# Patient Record
Sex: Male | Born: 1940 | Race: White | Hispanic: No | Marital: Married | Smoking: Former smoker
Health system: Southern US, Community
[De-identification: ages and names within clinical notes are randomized; demographics above are authoritative.]

## PROBLEM LIST (undated history)

## (undated) DIAGNOSIS — G2581 Restless legs syndrome: Secondary | ICD-10-CM

## (undated) DIAGNOSIS — N529 Male erectile dysfunction, unspecified: Secondary | ICD-10-CM

## (undated) DIAGNOSIS — E7800 Pure hypercholesterolemia, unspecified: Secondary | ICD-10-CM

## (undated) DIAGNOSIS — E785 Hyperlipidemia, unspecified: Secondary | ICD-10-CM

## (undated) DIAGNOSIS — I2510 Atherosclerotic heart disease of native coronary artery without angina pectoris: Secondary | ICD-10-CM

## (undated) DIAGNOSIS — Z872 Personal history of diseases of the skin and subcutaneous tissue: Secondary | ICD-10-CM

## (undated) DIAGNOSIS — F32A Depression, unspecified: Secondary | ICD-10-CM

## (undated) DIAGNOSIS — N183 Chronic kidney disease, stage 3 unspecified: Secondary | ICD-10-CM

## (undated) DIAGNOSIS — F329 Major depressive disorder, single episode, unspecified: Secondary | ICD-10-CM

## (undated) DIAGNOSIS — N419 Inflammatory disease of prostate, unspecified: Secondary | ICD-10-CM

## (undated) DIAGNOSIS — M1990 Unspecified osteoarthritis, unspecified site: Secondary | ICD-10-CM

## (undated) DIAGNOSIS — M545 Low back pain, unspecified: Secondary | ICD-10-CM

## (undated) DIAGNOSIS — I10 Essential (primary) hypertension: Secondary | ICD-10-CM

## (undated) DIAGNOSIS — K5790 Diverticulosis of intestine, part unspecified, without perforation or abscess without bleeding: Secondary | ICD-10-CM

## (undated) DIAGNOSIS — N189 Chronic kidney disease, unspecified: Secondary | ICD-10-CM

## (undated) DIAGNOSIS — I219 Acute myocardial infarction, unspecified: Secondary | ICD-10-CM

## (undated) HISTORY — DX: Major depressive disorder, single episode, unspecified: F32.9

## (undated) HISTORY — DX: Unspecified osteoarthritis, unspecified site: M19.90

## (undated) HISTORY — DX: Diverticulosis of intestine, part unspecified, without perforation or abscess without bleeding: K57.90

## (undated) HISTORY — PX: COLONOSCOPY: SHX174

## (undated) HISTORY — DX: Low back pain: M54.5

## (undated) HISTORY — DX: Depression, unspecified: F32.A

## (undated) HISTORY — DX: Chronic kidney disease, unspecified: N18.9

## (undated) HISTORY — PX: KNEE ARTHROSCOPY W/ ACL RECONSTRUCTION: SHX1858

## (undated) HISTORY — DX: Personal history of diseases of the skin and subcutaneous tissue: Z87.2

## (undated) HISTORY — DX: Acute myocardial infarction, unspecified: I21.9

## (undated) HISTORY — DX: Low back pain, unspecified: M54.50

## (undated) HISTORY — DX: Inflammatory disease of prostate, unspecified: N41.9

## (undated) HISTORY — DX: Restless legs syndrome: G25.81

## (undated) HISTORY — DX: Male erectile dysfunction, unspecified: N52.9

---

## 2001-06-13 ENCOUNTER — Ambulatory Visit (HOSPITAL_COMMUNITY): Admission: RE | Admit: 2001-06-13 | Discharge: 2001-06-13 | Payer: Self-pay | Admitting: Gastroenterology

## 2005-04-15 ENCOUNTER — Encounter: Admission: RE | Admit: 2005-04-15 | Discharge: 2005-07-14 | Payer: Self-pay | Admitting: Internal Medicine

## 2006-07-20 ENCOUNTER — Encounter: Admission: RE | Admit: 2006-07-20 | Discharge: 2006-07-20 | Payer: Self-pay | Admitting: Internal Medicine

## 2007-04-29 ENCOUNTER — Emergency Department (HOSPITAL_COMMUNITY): Admission: EM | Admit: 2007-04-29 | Discharge: 2007-04-29 | Payer: Self-pay | Admitting: Emergency Medicine

## 2011-07-12 LAB — I-STAT 8, (EC8 V) (CONVERTED LAB)
BUN: 19
Bicarbonate: 24
HCT: 50
Hemoglobin: 17
Operator id: 294521
Sodium: 139

## 2011-07-12 LAB — CBC
HCT: 47.7
Hemoglobin: 16.6
RBC: 5.23
WBC: 6.3

## 2011-07-12 LAB — URINALYSIS, ROUTINE W REFLEX MICROSCOPIC
Bilirubin Urine: NEGATIVE
Glucose, UA: NEGATIVE
Hgb urine dipstick: NEGATIVE
Ketones, ur: NEGATIVE
pH: 5.5

## 2011-07-12 LAB — DIFFERENTIAL
Eosinophils Relative: 2
Lymphocytes Relative: 24
Lymphs Abs: 1.5
Monocytes Absolute: 0.6
Monocytes Relative: 10

## 2012-02-02 DIAGNOSIS — E119 Type 2 diabetes mellitus without complications: Secondary | ICD-10-CM | POA: Insufficient documentation

## 2012-02-02 DIAGNOSIS — R197 Diarrhea, unspecified: Secondary | ICD-10-CM | POA: Insufficient documentation

## 2012-02-02 DIAGNOSIS — E7800 Pure hypercholesterolemia, unspecified: Secondary | ICD-10-CM | POA: Insufficient documentation

## 2012-02-02 DIAGNOSIS — I2510 Atherosclerotic heart disease of native coronary artery without angina pectoris: Secondary | ICD-10-CM | POA: Insufficient documentation

## 2012-02-03 ENCOUNTER — Encounter (HOSPITAL_COMMUNITY): Payer: Self-pay

## 2012-02-03 ENCOUNTER — Emergency Department (HOSPITAL_COMMUNITY)
Admission: EM | Admit: 2012-02-03 | Discharge: 2012-02-03 | Disposition: A | Discharge status: Home / Self Care | Payer: Medicare Other | Attending: Emergency Medicine | Admitting: Emergency Medicine

## 2012-02-03 DIAGNOSIS — R197 Diarrhea, unspecified: Secondary | ICD-10-CM

## 2012-02-03 HISTORY — DX: Atherosclerotic heart disease of native coronary artery without angina pectoris: I25.10

## 2012-02-03 HISTORY — DX: Pure hypercholesterolemia, unspecified: E78.00

## 2012-02-03 LAB — DIFFERENTIAL
Basophils Absolute: 0 10*3/uL (ref 0.0–0.1)
Basophils Relative: 0 % (ref 0–1)
Eosinophils Absolute: 0 10*3/uL (ref 0.0–0.7)
Neutrophils Relative %: 78 % — ABNORMAL HIGH (ref 43–77)

## 2012-02-03 LAB — CBC
MCH: 31.4 pg (ref 26.0–34.0)
MCHC: 34.3 g/dL (ref 30.0–36.0)
Platelets: 161 10*3/uL (ref 150–400)
RBC: 5.09 MIL/uL (ref 4.22–5.81)

## 2012-02-03 MED ORDER — SODIUM CHLORIDE 0.9 % IV BOLUS (SEPSIS)
1000.0000 mL | Freq: Once | INTRAVENOUS | Status: AC
  Administered 2012-02-03: 1000 mL via INTRAVENOUS

## 2012-02-03 NOTE — ED Notes (Signed)
Pt reports he takes 400-600 mg of Ibuprofen once a day for general body aches. He has been taking that dose for four months. Pt denies blood in his stool, any shortness of breath.

## 2012-02-03 NOTE — ED Provider Notes (Signed)
History     CSN: [id number]  Arrival date & time 02/02/12  2359   First MD Initiated Contact with Patient 02/03/12 0107      Chief Complaint  Patient presents with  . Diarrhea  . Fall    (Consider location/radiation/quality/duration/timing/severity/associated sxs/prior treatment) HPI Comments: Patient's grandchildren have been sick with a viral illness, including vomiting, and diarrhea.  Patient reports that for the past 2 days.  He has had nausea without vomiting, and then some diarrhea.  Today.  He just feels weak he attempted to get out of bed tonight to go to the restroom and slid to the floor and was too weak to get up on his own.  EMS was called and assisted him to the stretcher and transported him to the emergency department for further evaluation.  He has not taken any over-the-counter Imodium or other medications.  He states he has been trying to sip on fluids, but really has no appetite  Patient is a 71 y.o. male presenting with diarrhea and fall. The history is provided by the patient.  Diarrhea The primary symptoms include diarrhea and myalgias. Primary symptoms do not include fever, abdominal pain, nausea or dysuria.  The myalgias are associated with weakness.   The illness does not include chills.  Fall Pertinent negatives include no fever, no abdominal pain and no nausea.    Past Medical History  Diagnosis Date  . Diabetes mellitus   . Coronary artery disease   . High cholesterol     History reviewed. No pertinent past surgical history.  History reviewed. No pertinent family history.  History  Substance Use Topics  . Smoking status: Not on file  . Smokeless tobacco: Not on file  . Alcohol Use: No      Review of Systems  Constitutional: Negative for fever and chills.  Gastrointestinal: Positive for diarrhea. Negative for nausea and abdominal pain.  Genitourinary: Negative for dysuria.  Musculoskeletal: Positive for myalgias.  Skin: Positive for  pallor.  Neurological: Positive for weakness. Negative for dizziness.    Allergies  Review of patient's allergies indicates no known allergies.  Home Medications   Current Outpatient Rx  Name Route Sig Dispense Refill  . ASPIRIN EC 81 MG PO TBEC Oral Take 81 mg by mouth daily.    . IBUPROFEN 200 MG PO TABS Oral Take 400 mg by mouth every 6 (six) hours as needed. For pain    . SERTRALINE HCL 50 MG PO TABS Oral Take 50 mg by mouth daily.    Marland Kitchen ZOLPIDEM TARTRATE 10 MG PO TABS Oral Take 10 mg by mouth at bedtime as needed. For insomnia      BP 126/82  Pulse 100  Temp(Src) 99 F (37.2 C) (Oral)  Resp 18  SpO2 94%  Physical Exam  Constitutional: He is oriented to person, place, and time. He appears well-developed and well-nourished.  HENT:  Head: Normocephalic.  Eyes: Pupils are equal, round, and reactive to light.  Neck: Normal range of motion.  Cardiovascular: Normal rate.   Abdominal: Soft. He exhibits no distension. Bowel sounds are increased. There is no tenderness.  Musculoskeletal: Normal range of motion.  Neurological: He is alert and oriented to person, place, and time.  Skin: There is pallor.    ED Course  Procedures (including critical care time)  Labs Reviewed  DIFFERENTIAL - Abnormal; Notable for the following:    Neutrophils Relative 78 (*)    Lymphocytes Relative 6 (*)    Lymphs Abs  0.4 (*)    Monocytes Relative 16 (*)    Monocytes Absolute 1.2 (*)    All other components within normal limits  CBC   No results found.   1. Diarrhea     DAT reviewed.  Normal 3:30 patient is feeling, better after height to be hydration.  Able to walk to the bathroom unassisted MDM   This is most likely a viral illness from his grandchildren.  Will hydrate obtain CBC and i-STAT        Arman Filter, NP 02/03/12 [phone]

## 2012-02-03 NOTE — ED Notes (Signed)
Pt took his nighttime ambiem around 9pm and then tried to get up a few hours later

## 2012-02-03 NOTE — Discharge Instructions (Signed)
B.R.A.T. Diet Your doctor has recommended the B.R.A.T. diet for you or your child until the condition improves. This is often used to help control diarrhea and vomiting symptoms. If you or your child can tolerate clear liquids, you may have:  Bananas.   Rice.   Applesauce.   Toast (and other simple starches such as crackers, potatoes, noodles).  Be sure to avoid dairy products, meats, and fatty foods until symptoms are better. Fruit juices such as apple, grape, and prune juice can make diarrhea worse. Avoid these. Continue this diet for 2 days or as instructed by your caregiver. Document Released: 09/13/2005 Document Revised: 09/02/2011 Document Reviewed: 03/02/2007 [facility] Patient Information 2012 [place], [place]. Follow a brat diet for the next 24 hours and resume your normal diet

## 2012-02-03 NOTE — ED Notes (Signed)
pts wife states that the diarrhea started yesterday and all through the night, no food intake except small bowl of soup, has been drinking lots of water today, grandchildren have been sick. Pt states that he slid onto the floor from the bed and was unable to get himself up

## 2012-02-03 NOTE — ED Notes (Signed)
Pt main complaint is generalized weakness a sore pain all over body

## 2012-02-04 LAB — POCT I-STAT, CHEM 8
HCT: 49 % (ref 39.0–52.0)
Hemoglobin: 16.7 g/dL (ref 13.0–17.0)
Potassium: 4.2 mEq/L (ref 3.5–5.1)
Sodium: 139 mEq/L (ref 135–145)

## 2012-02-04 NOTE — ED Provider Notes (Signed)
Medical screening examination/treatment/procedure(s) were performed by non-physician practitioner and as supervising physician I was immediately available for consultation/collaboration.  Gerhard Munch, MD 02/04/12 [phone]

## 2012-09-27 DIAGNOSIS — I219 Acute myocardial infarction, unspecified: Secondary | ICD-10-CM

## 2012-09-27 HISTORY — DX: Acute myocardial infarction, unspecified: I21.9

## 2013-07-28 HISTORY — PX: CARDIAC CATHETERIZATION: SHX172

## 2014-05-11 ENCOUNTER — Encounter: Payer: Self-pay | Admitting: *Deleted

## 2015-03-10 ENCOUNTER — Other Ambulatory Visit: Payer: Self-pay | Admitting: Orthopedic Surgery

## 2015-03-10 DIAGNOSIS — M25562 Pain in left knee: Secondary | ICD-10-CM

## 2015-03-22 ENCOUNTER — Ambulatory Visit
Admission: RE | Admit: 2015-03-22 | Discharge: 2015-03-22 | Disposition: A | Discharge status: Home / Self Care | Payer: Medicare Other | Source: Ambulatory Visit | Attending: Orthopedic Surgery | Admitting: Orthopedic Surgery

## 2015-03-22 DIAGNOSIS — M23352 Other meniscus derangements, posterior horn of lateral meniscus, left knee: Secondary | ICD-10-CM | POA: Diagnosis not present

## 2015-03-22 DIAGNOSIS — M25562 Pain in left knee: Secondary | ICD-10-CM

## 2015-03-22 DIAGNOSIS — M2242 Chondromalacia patellae, left knee: Secondary | ICD-10-CM | POA: Diagnosis not present

## 2015-04-29 DIAGNOSIS — M25562 Pain in left knee: Secondary | ICD-10-CM | POA: Diagnosis not present

## 2015-05-09 DIAGNOSIS — M67462 Ganglion, left knee: Secondary | ICD-10-CM | POA: Diagnosis not present

## 2015-05-09 DIAGNOSIS — M23262 Derangement of other lateral meniscus due to old tear or injury, left knee: Secondary | ICD-10-CM | POA: Diagnosis not present

## 2015-05-09 DIAGNOSIS — M94262 Chondromalacia, left knee: Secondary | ICD-10-CM | POA: Diagnosis not present

## 2015-05-09 DIAGNOSIS — G8918 Other acute postprocedural pain: Secondary | ICD-10-CM | POA: Diagnosis not present

## 2015-05-09 DIAGNOSIS — M23342 Other meniscus derangements, anterior horn of lateral meniscus, left knee: Secondary | ICD-10-CM | POA: Diagnosis not present

## 2015-05-09 DIAGNOSIS — M6752 Plica syndrome, left knee: Secondary | ICD-10-CM | POA: Diagnosis not present

## 2015-05-16 DIAGNOSIS — R2689 Other abnormalities of gait and mobility: Secondary | ICD-10-CM | POA: Diagnosis not present

## 2015-05-16 DIAGNOSIS — M25562 Pain in left knee: Secondary | ICD-10-CM | POA: Diagnosis not present

## 2015-05-16 DIAGNOSIS — M25662 Stiffness of left knee, not elsewhere classified: Secondary | ICD-10-CM | POA: Diagnosis not present

## 2015-05-21 ENCOUNTER — Inpatient Hospital Stay (HOSPITAL_COMMUNITY)
Admission: EM | Admit: 2015-05-21 | Discharge: 2015-05-24 | DRG: 872 | Disposition: A | Discharge status: Home / Self Care | Payer: Medicare PPO | Attending: Internal Medicine | Admitting: Internal Medicine

## 2015-05-21 ENCOUNTER — Encounter (HOSPITAL_COMMUNITY): Payer: Self-pay | Admitting: *Deleted

## 2015-05-21 ENCOUNTER — Emergency Department (HOSPITAL_COMMUNITY): Payer: Medicare PPO

## 2015-05-21 DIAGNOSIS — M25462 Effusion, left knee: Secondary | ICD-10-CM | POA: Diagnosis present

## 2015-05-21 DIAGNOSIS — I252 Old myocardial infarction: Secondary | ICD-10-CM

## 2015-05-21 DIAGNOSIS — E785 Hyperlipidemia, unspecified: Secondary | ICD-10-CM | POA: Diagnosis not present

## 2015-05-21 DIAGNOSIS — M25062 Hemarthrosis, left knee: Secondary | ICD-10-CM | POA: Diagnosis not present

## 2015-05-21 DIAGNOSIS — A419 Sepsis, unspecified organism: Secondary | ICD-10-CM

## 2015-05-21 DIAGNOSIS — A4151 Sepsis due to Escherichia coli [E. coli]: Principal | ICD-10-CM | POA: Diagnosis present

## 2015-05-21 DIAGNOSIS — R509 Fever, unspecified: Secondary | ICD-10-CM | POA: Diagnosis not present

## 2015-05-21 DIAGNOSIS — I6789 Other cerebrovascular disease: Secondary | ICD-10-CM | POA: Diagnosis not present

## 2015-05-21 DIAGNOSIS — N183 Chronic kidney disease, stage 3 (moderate): Secondary | ICD-10-CM | POA: Diagnosis not present

## 2015-05-21 DIAGNOSIS — R251 Tremor, unspecified: Secondary | ICD-10-CM | POA: Diagnosis not present

## 2015-05-21 DIAGNOSIS — I2510 Atherosclerotic heart disease of native coronary artery without angina pectoris: Secondary | ICD-10-CM | POA: Diagnosis present

## 2015-05-21 DIAGNOSIS — M009 Pyogenic arthritis, unspecified: Secondary | ICD-10-CM | POA: Diagnosis present

## 2015-05-21 DIAGNOSIS — N390 Urinary tract infection, site not specified: Secondary | ICD-10-CM | POA: Diagnosis present

## 2015-05-21 DIAGNOSIS — Z79899 Other long term (current) drug therapy: Secondary | ICD-10-CM

## 2015-05-21 DIAGNOSIS — Z888 Allergy status to other drugs, medicaments and biological substances status: Secondary | ICD-10-CM

## 2015-05-21 DIAGNOSIS — Z87891 Personal history of nicotine dependence: Secondary | ICD-10-CM

## 2015-05-21 DIAGNOSIS — R4182 Altered mental status, unspecified: Secondary | ICD-10-CM | POA: Diagnosis not present

## 2015-05-21 DIAGNOSIS — R410 Disorientation, unspecified: Secondary | ICD-10-CM | POA: Diagnosis not present

## 2015-05-21 DIAGNOSIS — R8281 Pyuria: Secondary | ICD-10-CM | POA: Diagnosis present

## 2015-05-21 DIAGNOSIS — F4489 Other dissociative and conversion disorders: Secondary | ICD-10-CM | POA: Diagnosis not present

## 2015-05-21 DIAGNOSIS — Z9889 Other specified postprocedural states: Secondary | ICD-10-CM

## 2015-05-21 HISTORY — DX: Hyperlipidemia, unspecified: E78.5

## 2015-05-21 HISTORY — DX: Chronic kidney disease, stage 3 (moderate): N18.3

## 2015-05-21 HISTORY — DX: Chronic kidney disease, stage 3 unspecified: N18.30

## 2015-05-21 LAB — COMPREHENSIVE METABOLIC PANEL
ALK PHOS: 62 U/L (ref 38–126)
ALT: 13 U/L — ABNORMAL LOW (ref 17–63)
ANION GAP: 11 (ref 5–15)
AST: 24 U/L (ref 15–41)
Albumin: 3.6 g/dL (ref 3.5–5.0)
BILIRUBIN TOTAL: 2 mg/dL — AB (ref 0.3–1.2)
BUN: 18 mg/dL (ref 6–20)
CALCIUM: 9.2 mg/dL (ref 8.9–10.3)
CO2: 22 mmol/L (ref 22–32)
Chloride: 102 mmol/L (ref 101–111)
Creatinine, Ser: 1.43 mg/dL — ABNORMAL HIGH (ref 0.61–1.24)
GFR calc non Af Amer: 47 mL/min — ABNORMAL LOW (ref >60)
GFR, EST AFRICAN AMERICAN: 55 mL/min — AB (ref >60)
Glucose, Bld: 138 mg/dL — ABNORMAL HIGH (ref 65–99)
Potassium: 4.4 mmol/L (ref 3.5–5.1)
Sodium: 135 mmol/L (ref 135–145)
TOTAL PROTEIN: 6.8 g/dL (ref 6.5–8.1)

## 2015-05-21 LAB — URINALYSIS, ROUTINE W REFLEX MICROSCOPIC
Bilirubin Urine: NEGATIVE
Glucose, UA: NEGATIVE mg/dL
HGB URINE DIPSTICK: NEGATIVE
KETONES UR: 15 mg/dL — AB
Nitrite: NEGATIVE
PROTEIN: 30 mg/dL — AB
Specific Gravity, Urine: 1.025 (ref 1.005–1.030)
UROBILINOGEN UA: 1 mg/dL (ref 0.0–1.0)
pH: 5.5 (ref 5.0–8.0)

## 2015-05-21 LAB — CBC WITH DIFFERENTIAL/PLATELET
Basophils Absolute: 0 10*3/uL (ref 0.0–0.1)
Basophils Relative: 0 % (ref 0–1)
EOS ABS: 0 10*3/uL (ref 0.0–0.7)
Eosinophils Relative: 0 % (ref 0–5)
HEMATOCRIT: 39.8 % (ref 39.0–52.0)
HEMOGLOBIN: 13.7 g/dL (ref 13.0–17.0)
LYMPHS ABS: 1.1 10*3/uL (ref 0.7–4.0)
Lymphocytes Relative: 9 % — ABNORMAL LOW (ref 12–46)
MCH: 31.1 pg (ref 26.0–34.0)
MCHC: 34.4 g/dL (ref 30.0–36.0)
MCV: 90.2 fL (ref 78.0–100.0)
MONOS PCT: 11 % (ref 3–12)
Monocytes Absolute: 1.4 10*3/uL — ABNORMAL HIGH (ref 0.1–1.0)
NEUTROS ABS: 10 10*3/uL — AB (ref 1.7–7.7)
NEUTROS PCT: 80 % — AB (ref 43–77)
Platelets: 208 10*3/uL (ref 150–400)
RBC: 4.41 MIL/uL (ref 4.22–5.81)
RDW: 12.6 % (ref 11.5–15.5)
WBC: 12.6 10*3/uL — ABNORMAL HIGH (ref 4.0–10.5)

## 2015-05-21 LAB — RAPID URINE DRUG SCREEN, HOSP PERFORMED
Amphetamines: NOT DETECTED
BARBITURATES: NOT DETECTED
Benzodiazepines: NOT DETECTED
COCAINE: NOT DETECTED
OPIATES: POSITIVE — AB
Tetrahydrocannabinol: NOT DETECTED

## 2015-05-21 LAB — AMMONIA: Ammonia: 20 umol/L (ref 9–35)

## 2015-05-21 LAB — I-STAT CG4 LACTIC ACID, ED
LACTIC ACID, VENOUS: 2.62 mmol/L — AB (ref 0.5–2.0)
LACTIC ACID, VENOUS: 2.25 mmol/L — AB (ref 0.5–2.0)

## 2015-05-21 LAB — D-DIMER, QUANTITATIVE: D-Dimer, Quant: 1.79 ug/mL-FEU — ABNORMAL HIGH (ref 0.00–0.48)

## 2015-05-21 LAB — CBG MONITORING, ED: GLUCOSE-CAPILLARY: 124 mg/dL — AB (ref 65–99)

## 2015-05-21 LAB — URINE MICROSCOPIC-ADD ON

## 2015-05-21 LAB — ETHANOL: Alcohol, Ethyl (B): <5 mg/dL (ref <5)

## 2015-05-21 LAB — I-STAT TROPONIN, ED: Troponin i, poc: 0 ng/mL (ref 0.00–0.08)

## 2015-05-21 MED ORDER — SODIUM CHLORIDE 0.9 % IV BOLUS (SEPSIS)
1000.0000 mL | INTRAVENOUS | Status: AC
  Administered 2015-05-21 (×3): 1000 mL via INTRAVENOUS

## 2015-05-21 MED ORDER — HEPARIN SODIUM (PORCINE) 5000 UNIT/ML IJ SOLN
5000.0000 [IU] | Freq: Three times a day (TID) | INTRAMUSCULAR | Status: DC
  Administered 2015-05-21 – 2015-05-23 (×5): 5000 [IU] via SUBCUTANEOUS
  Filled 2015-05-21 (×7): qty 1

## 2015-05-21 MED ORDER — DEXTROSE 5 % IV SOLN
2.0000 g | Freq: Once | INTRAVENOUS | Status: AC
  Administered 2015-05-21: 2 g via INTRAVENOUS
  Filled 2015-05-21: qty 2

## 2015-05-21 MED ORDER — VANCOMYCIN HCL IN DEXTROSE 750-5 MG/150ML-% IV SOLN
750.0000 mg | Freq: Two times a day (BID) | INTRAVENOUS | Status: DC
  Administered 2015-05-22 – 2015-05-23 (×3): 750 mg via INTRAVENOUS
  Filled 2015-05-21 (×4): qty 150

## 2015-05-21 MED ORDER — SODIUM CHLORIDE 0.9 % IV BOLUS (SEPSIS)
500.0000 mL | INTRAVENOUS | Status: AC
  Administered 2015-05-21: 500 mL via INTRAVENOUS

## 2015-05-21 MED ORDER — ACETAMINOPHEN 650 MG RE SUPP
650.0000 mg | Freq: Once | RECTAL | Status: AC
  Administered 2015-05-21: 650 mg via RECTAL
  Filled 2015-05-21: qty 1

## 2015-05-21 MED ORDER — METOPROLOL SUCCINATE ER 25 MG PO TB24
25.0000 mg | ORAL_TABLET | Freq: Every day | ORAL | Status: DC
  Administered 2015-05-21 – 2015-05-24 (×4): 25 mg via ORAL
  Filled 2015-05-21 (×4): qty 1

## 2015-05-21 MED ORDER — SODIUM CHLORIDE 0.9 % IV SOLN
INTRAVENOUS | Status: DC
  Administered 2015-05-21 – 2015-05-23 (×2): via INTRAVENOUS

## 2015-05-21 MED ORDER — ACETAMINOPHEN 325 MG PO TABS: 650.0000 mg | ORAL_TABLET | Freq: Once | ORAL | Status: DC

## 2015-05-21 MED ORDER — SODIUM CHLORIDE 0.9 % IJ SOLN
3.0000 mL | Freq: Two times a day (BID) | INTRAMUSCULAR | Status: DC
  Administered 2015-05-22: 3 mL via INTRAVENOUS

## 2015-05-21 MED ORDER — ACETAMINOPHEN 500 MG PO TABS
1000.0000 mg | ORAL_TABLET | Freq: Four times a day (QID) | ORAL | Status: DC | PRN
  Administered 2015-05-23 – 2015-05-24 (×2): 1000 mg via ORAL
  Filled 2015-05-21 (×2): qty 2

## 2015-05-21 MED ORDER — OXYCODONE-ACETAMINOPHEN 5-325 MG PO TABS
1.0000 | ORAL_TABLET | Freq: Four times a day (QID) | ORAL | Status: DC | PRN
  Filled 2015-05-21: qty 1

## 2015-05-21 MED ORDER — MORPHINE SULFATE (PF) 4 MG/ML IV SOLN
4.0000 mg | Freq: Once | INTRAVENOUS | Status: DC
  Filled 2015-05-21: qty 1

## 2015-05-21 MED ORDER — DOCUSATE SODIUM 100 MG PO CAPS
100.0000 mg | ORAL_CAPSULE | Freq: Every day | ORAL | Status: DC
  Administered 2015-05-21 – 2015-05-23 (×3): 100 mg via ORAL
  Filled 2015-05-21 (×3): qty 1

## 2015-05-21 MED ORDER — TRAMADOL HCL 50 MG PO TABS: 50.0000 mg | ORAL_TABLET | Freq: Every day | ORAL | Status: DC | PRN

## 2015-05-21 MED ORDER — LORAZEPAM 2 MG/ML IJ SOLN
1.0000 mg | Freq: Once | INTRAMUSCULAR | Status: AC
  Administered 2015-05-21: 1 mg via INTRAVENOUS
  Filled 2015-05-21: qty 1

## 2015-05-21 MED ORDER — DEXTROSE 5 % IV SOLN
2.0000 g | INTRAVENOUS | Status: DC
  Administered 2015-05-22 – 2015-05-23 (×2): 2 g via INTRAVENOUS
  Filled 2015-05-21 (×3): qty 2

## 2015-05-21 MED ORDER — VANCOMYCIN HCL 10 G IV SOLR
2000.0000 mg | Freq: Once | INTRAVENOUS | Status: AC
  Administered 2015-05-21: 2000 mg via INTRAVENOUS
  Filled 2015-05-21: qty 2000

## 2015-05-21 MED ORDER — LORAZEPAM 2 MG/ML IJ SOLN
1.0000 mg | Freq: Four times a day (QID) | INTRAMUSCULAR | Status: DC | PRN
  Administered 2015-05-21: 1 mg via INTRAVENOUS
  Filled 2015-05-21: qty 1

## 2015-05-21 MED ORDER — CLOPIDOGREL BISULFATE 75 MG PO TABS
75.0000 mg | ORAL_TABLET | Freq: Every day | ORAL | Status: DC
  Administered 2015-05-21 – 2015-05-24 (×4): 75 mg via ORAL
  Filled 2015-05-21 (×4): qty 1

## 2015-05-21 NOTE — ED Notes (Signed)
Patient transported to CT 

## 2015-05-21 NOTE — H&P (Signed)
[facility] History and Physical  Masato Pettie NWG:[id number] DOB: 06-Sep-1941 DOA: 05/21/2015  Referring physician: EDP PCP: No primary care provider on file.   Chief Complaint: AMS   HPI: Judge Duque is a 74 y.o. male h/o MI in 2014, just had meniscal tear repaired in his left knee last week at surgery center with Dr. Luiz Blare.  Prior to today had actually been doing better than expected / doing more than he was supposed to even.  Had fully returned to work despite recency of surgery and age.  Today at lunch, developed confusion, shivering.  Brought in to ED by EMS.  Found to have T of 103.  Review of Systems: Systems reviewed.  As above, otherwise negative  Past Medical History  Diagnosis Date  . MI (myocardial infarction) 2014  . DM (diabetes mellitus)   . HLD (hyperlipidemia)   . CKD (chronic kidney disease) stage 3, GFR 30-59 ml/min    Past Surgical History  Procedure Laterality Date  . Knee arthroscopy w/ acl reconstruction      left   Social History:  reports that he has quit smoking. He has never used smokeless tobacco. He reports that he does not drink alcohol or use illicit drugs.  Allergies  Allergen Reactions  . Novocain [Procaine]     Unknown per daughter, hasnt had since he was a child, is listed on allergy list on other chart that is being merged into this one.  . Statins     Myalgias with crestor and zocor and pravachol    History reviewed. No pertinent family history.   Prior to Admission medications   Medication Sig Start Date End Date Taking? Authorizing Provider  acetaminophen (TYLENOL) 500 MG tablet Take 1,000 mg by mouth every 6 (six) hours as needed for moderate pain.   Yes Historical Provider, MD  docusate sodium (COLACE) 100 MG capsule Take 100 mg by mouth at bedtime.   Yes Historical Provider, MD   Physical Exam: Filed Vitals:   05/21/15 2002  BP: 156/73  Pulse: 105  Temp:   Resp: 19    BP 156/73 mmHg  Pulse 105  Temp(Src) 103 F  (39.4 C) (Rectal)  Resp 19  Ht 6\' 2"  (1.88 m)  Wt 113.399 kg (250 lb)  BMI 32.08 kg/m2  SpO2 100%  General Appearance:    Alert, oriented, no distress, appears stated age  Head:    Normocephalic, atraumatic  Eyes:    PERRL, EOMI, sclera non-icteric        Nose:   Nares without drainage or epistaxis. Mucosa, turbinates normal  Throat:   Moist mucous membranes. Oropharynx without erythema or exudate.  Neck:   Supple. No carotid bruits.  No thyromegaly.  No lymphadenopathy.   Back:     No CVA tenderness, no spinal tenderness  Lungs:     Clear to auscultation bilaterally, without wheezes, rhonchi or rales  Chest wall:    No tenderness to palpitation  Heart:    Regular rate and rhythm without murmurs, gallops, rubs  Abdomen:     Soft, non-tender, nondistended, normal bowel sounds, no organomegaly  Genitalia:    deferred  Rectal:    deferred  Extremities:   Mild swelling of left knee with skin warmth compared to right.  Has bruising, very mild erythema only at this point.  Incisions appear C/D/I without obvious signs of incision infection.  Pulses:   2+ and symmetric all extremities  Skin:   Skin color, texture, turgor normal, no  rashes or lesions  Lymph nodes:   Cervical, supraclavicular, and axillary nodes normal  Neurologic:   CNII-XII intact. Normal strength, sensation and reflexes      throughout    Labs on Admission:  Basic Metabolic Panel:  Recent Labs Lab 05/21/15 1845  NA 135  K 4.4  CL 102  CO2 22  GLUCOSE 138*  BUN 18  CREATININE 1.43*  CALCIUM 9.2   Liver Function Tests:  Recent Labs Lab 05/21/15 1845  AST 24  ALT 13*  ALKPHOS 62  BILITOT 2.0*  PROT 6.8  ALBUMIN 3.6   No results for input(s): LIPASE, AMYLASE in the last 168 hours.  Recent Labs Lab 05/21/15 1845  AMMONIA 20   CBC:  Recent Labs Lab 05/21/15 1845  WBC 12.6*  NEUTROABS 10.0*  HGB 13.7  HCT 39.8  MCV 90.2  PLT 208   Cardiac Enzymes: No results for input(s): CKTOTAL, CKMB,  CKMBINDEX, TROPONINI in the last 168 hours.  BNP (last 3 results) No results for input(s): PROBNP in the last 8760 hours. CBG:  Recent Labs Lab 05/21/15 1716  GLUCAP 124*    Radiological Exams on Admission: Dg Chest 2 View  05/21/2015   CLINICAL DATA:  Altered mental status, tremors, falling asleep, not responding.  EXAM: CHEST  2 VIEW  COMPARISON:  None.  FINDINGS: Trachea is midline. Heart size is accentuated by low lung volumes and AP semi upright technique. Lungs are grossly clear. No pleural fluid. Old left rib fractures.  IMPRESSION: Low lung volumes.  No acute findings.   Electronically Signed   By: Leanna Battles M.D.   On: 05/21/2015 18:04   Ct Head Wo Contrast  05/21/2015   CLINICAL DATA:  Altered mental status. Confusion. Decreased level of responsiveness. Possible loss of consciousness but no fall or trauma.  EXAM: CT HEAD WITHOUT CONTRAST  TECHNIQUE: Contiguous axial images were obtained from the base of the skull through the vertex without intravenous contrast.  COMPARISON:  None.  FINDINGS: Mild diffuse cerebral atrophy. Patchy low-attenuation changes in the deep white matter consistent with mild small vessel ischemic change. Mild ventricular dilatation consistent with central atrophy. No mass effect or midline shift. No abnormal extra-axial fluid collections. Gray-white matter junctions are distinct. Basal cisterns are not effaced. No evidence of acute intracranial hemorrhage. No depressed skull fractures. Retention cyst and mucosal thickening in the left maxillary antrum. Mastoid air cells are not opacified.  IMPRESSION: No acute intracranial abnormalities. Mild chronic atrophy and small vessel ischemic change.   Electronically Signed   By: Burman Nieves M.D.   On: 05/21/2015 18:22    EKG: Independently reviewed.  Assessment/Plan Principal Problem:   Sepsis Active Problems:   Pyuria   1. Sepsis - 2 possible sources: UTI vs septic arthritis of left knee 1. Will  empirically treat with rocephin and vanc 2. Does have some pyuria with 11-20 WBC in urine, few bacteria, but only small leukocyte esterase 3. Ortho consulted and will see patient in AM 4. Blood and urine cultures pending 5. Will see if ortho decides to tap knee in AM or not. 6. Repeat CBC in AM to trend leukocytosis 7. Tylenol PRN fever 8. Tele monitor for tachycardia 9. IVF at 100 cc/hr    Code Status: Full  Family Communication: Family at bedside Disposition Plan: Admit to inpatient   Time spent: 70 min  GARDNER, JARED M. [facility] Pager [phone]  If 7AM-7PM, please contact the day team taking care of the patient [email or web address]  Password [email or web address] 05/21/2015, 8:47 PM

## 2015-05-21 NOTE — ED Notes (Signed)
Attempted to give report X1. Nurse stated she would call back in 5 min

## 2015-05-21 NOTE — ED Notes (Signed)
Went into pt room, pt had urinated on self again. Cleaned up pt. Instructed pt and family to use call bell when pt needs to use the bathroom

## 2015-05-21 NOTE — ED Notes (Signed)
Went to check on pt, pt sitting on edge of bed and had urinated on floor. Cleaned up pt and helped him get back in bed, instructed pt again to use call bell if he needs to use bathroom

## 2015-05-21 NOTE — ED Notes (Signed)
EMS was called to [employer] [facility] because pt was shaking and falling asleep/pt wasn't responding.  EMS vitals 150/78, 84 pulse, 30 resp, 99 2 liters.  18 L FA.  Possible LOC, no falling or trauma.

## 2015-05-21 NOTE — ED Provider Notes (Signed)
CSN: [id number]     Arrival date & time 05/21/15  1700 History   First MD Initiated Contact with Patient 05/21/15 1712     Chief Complaint  Patient presents with  . Loss of Consciousness     (Consider location/radiation/quality/duration/timing/severity/associated sxs/prior Treatment) The history is provided by medical records and a relative.    This is a 74 year old male with history of MI in 2014, presenting to the ED for loss of consciousness/confusion. Majority of history was obtained by patient's daughter at bedside and other daughter that I spoke to on the phone who lives in [place].  Daughter i spoke with on the phone reports that today she called her father around 46 as he was leaving his office and stated he was driving to a cafe to get some lunch.  Daughter reports he sounded "off" and very "spaced out".  She states they hung out and she called back approx 30 minutes later but he did not answer.  She did call the restaurant and manager reported that he appeared very sleepy and was somewhat tremulous at the table. Daughter reports he has been fairly healthy recently.  He did have arthroscopic knee surgery on his left knee approximately 10 days ago for a meniscal tear by Dr. Luiz Blare. He was having a great deal of pain afterwards, so went back to the office and was prescribed pain medication. Daughter has concerned that he may have overmedicated on medications. Patient has no significant history of substance abuse in the past.  On arrival to ED, patient states he feels fine. He denies any chest pain, shortness of breath, dizziness, or weakness. He reports he does have some continued pain in his left knee as expected. He states he does not feel confused at this time. PCP-- Kirby Funk  Past Medical History  Diagnosis Date  . MI (myocardial infarction) 2014   Past Surgical History  Procedure Laterality Date  . Knee arthroscopy w/ acl reconstruction      left   History reviewed. No  pertinent family history. Social History  Substance Use Topics  . Smoking status: Former Games developer  . Smokeless tobacco: Never Used  . Alcohol Use: No    Review of Systems  Unable to perform ROS: Mental status change      Allergies  Review of patient's allergies indicates no known allergies.  Home Medications   Prior to Admission medications   Not on File   BP 161/69 mmHg  Pulse 84  Temp(Src) 98.3 F (36.8 C) (Oral)  Resp 24  Ht 6\' 2"  (1.88 m)  Wt 250 lb (113.399 kg)  BMI 32.08 kg/m2  SpO2 100%   Physical Exam  Constitutional: He appears well-developed and well-nourished. No distress.  HENT:  Head: Normocephalic and atraumatic.  Mouth/Throat: Oropharynx is clear and moist.  Eyes: Conjunctivae and EOM are normal. Pupils are equal, round, and reactive to light.  Neck: Normal range of motion. Neck supple.  Cardiovascular: Normal rate, regular rhythm and normal heart sounds.   Pulmonary/Chest: Effort normal and breath sounds normal. No respiratory distress. He has no wheezes.  Abdominal: Soft. Bowel sounds are normal. There is no tenderness. There is no guarding.  Musculoskeletal: Normal range of motion. He exhibits no edema.  Swelling and bruising noted to left knee and calf in various stages of healing; arthroscopic surgical incision appear clean without signs of infection; no apparent overlying cellulitis or lymphangitis; nearly full flexion/extension without significant pain; leg is NVI  Neurological: He is alert. He  displays no tremor. He displays no seizure activity.  Awake, answering limited questions and following some commands; no active tremors  Skin: Skin is warm and dry. He is not diaphoretic.  Psychiatric: He has a normal mood and affect.  Nursing note and vitals reviewed.   ED Course  Procedures (including critical care time)  This patient meets SIRS Criteria and may be septic. SIRS = Systemic Inflammatory Response Syndrome  Best Practice Recommends:    Notify the nurse immediately to increase monitoring of patient.   The recent clinical data is shown below. Filed Vitals:   05/21/15 1715 05/21/15 1730 05/21/15 1833 05/21/15 2002  BP: 161/69 156/73 138/73 156/73  Pulse: 84 88 88 105  Temp:   103 F (39.4 C)   TempSrc:   Rectal   Resp: Height:      Weight:      SpO2: 100% 100% 100% 100%     Sepsis - Repeat Assessment  Performed at:    8:05 PM   Vitals     Blood pressure 156/73, pulse 105, temperature 103 F (39.4 C), temperature source Rectal, resp. rate 19, height  (1.88 m), weight 250 lb (113.399 kg), SpO2 100 %.  Heart:     Tachycardic  Lungs:    CTA  Capillary Refill:   <2 sec  Peripheral Pulse:   Radial pulse palpable  Skin:     Normal Color   Labs Review Labs Reviewed  CBC WITH DIFFERENTIAL/PLATELET - Abnormal; Notable for the following:    WBC 12.6 (*)    Neutrophils Relative % 80 (*)    Neutro Abs 10.0 (*)    Lymphocytes Relative 9 (*)    Monocytes Absolute 1.4 (*)    All other components within normal limits  COMPREHENSIVE METABOLIC PANEL - Abnormal; Notable for the following:    Glucose, Bld 138 (*)    Creatinine, Ser 1.43 (*)    ALT 13 (*)    Total Bilirubin 2.0 (*)    GFR calc non Af Amer 47 (*)    GFR calc Af Amer 55 (*)    All other components within normal limits  URINE RAPID DRUG SCREEN, HOSP PERFORMED - Abnormal; Notable for the following:    Opiates POSITIVE (*)    All other components within normal limits  URINALYSIS, ROUTINE W REFLEX MICROSCOPIC (NOT AT [facility]) - Abnormal; Notable for the following:    Color, Urine AMBER (*)    Ketones, ur 15 (*)    Protein, ur 30 (*)    Leukocytes, UA SMALL (*)    All other components within normal limits  D-DIMER, QUANTITATIVE (NOT AT [facility]) - Abnormal; Notable for the following:    D-Dimer, Quant 1.79 (*)    All other components within normal limits  URINE MICROSCOPIC-ADD ON - Abnormal; Notable for the following:    Bacteria,  UA FEW (*)    All other components within normal limits  CBG MONITORING, ED - Abnormal; Notable for the following:    Glucose-Capillary 124 (*)    All other components within normal limits  I-STAT CG4 LACTIC ACID, ED - Abnormal; Notable for the following:    Lactic Acid, Venous 2.62 (*)    All other components within normal limits  I-STAT CG4 LACTIC ACID, ED - Abnormal; Notable for the following:    Lactic Acid, Venous 2.25 (*)    All other components within normal limits  URINE CULTURE  CULTURE, BLOOD (ROUTINE X 2)  CULTURE, BLOOD (ROUTINE X 2)  ETHANOL  AMMONIA  CBC  BASIC METABOLIC PANEL  I-STAT TROPOININ, ED    Imaging Review Dg Chest 2 View  05/21/2015   CLINICAL DATA:  Altered mental status, tremors, falling asleep, not responding.  EXAM: CHEST  2 VIEW  COMPARISON:  None.  FINDINGS: Trachea is midline. Heart size is accentuated by low lung volumes and AP semi upright technique. Lungs are grossly clear. No pleural fluid. Old left rib fractures.  IMPRESSION: Low lung volumes.  No acute findings.   Electronically Signed   By: Leanna Battles M.D.   On: 05/21/2015 18:04   Ct Head Wo Contrast  05/21/2015   CLINICAL DATA:  Altered mental status. Confusion. Decreased level of responsiveness. Possible loss of consciousness but no fall or trauma.  EXAM: CT HEAD WITHOUT CONTRAST  TECHNIQUE: Contiguous axial images were obtained from the base of the skull through the vertex without intravenous contrast.  COMPARISON:  None.  FINDINGS: Mild diffuse cerebral atrophy. Patchy low-attenuation changes in the deep white matter consistent with mild small vessel ischemic change. Mild ventricular dilatation consistent with central atrophy. No mass effect or midline shift. No abnormal extra-axial fluid collections. Gray-white matter junctions are distinct. Basal cisterns are not effaced. No evidence of acute intracranial hemorrhage. No depressed skull fractures. Retention cyst and mucosal thickening in the  left maxillary antrum. Mastoid air cells are not opacified.  IMPRESSION: No acute intracranial abnormalities. Mild chronic atrophy and small vessel ischemic change.   Electronically Signed   By: Burman Nieves M.D.   On: 05/21/2015 18:22   I have personally reviewed and evaluated these images and lab results as part of my medical decision-making.   EKG Interpretation   Date/Time:  Wednesday May 21 2015 17:02:26 EDT Ventricular Rate:  89 PR Interval:    QRS Duration: 82 QT Interval:  370 QTC Calculation: 450 R Axis:   21 Text Interpretation:  Poor data quality Normal sinus rhythm Artifact No  old tracing to compare Confirmed by [facility]  MD, Nicholos Johns [phone]) on  05/21/2015 5:37:19 PM      MDM   Final diagnoses:  Sepsis, due to unspecified organism  Fever, unspecified fever cause   74 y.o. M here with confusion/AMS.  Majority of history provided by patient's daughter via telephone who lives in [place].  Patient seemed altered when talking with him on the phone around 1330 today.  Recent knee surgery, ? Accidental narcotic overdose per daughter.  Patient denies current confusion or any physical symptoms at all.   Left knee has post-surgical bruising, however incisions appear clean without signs of infection.  There is some warmth to touch when compared with right knee-- no definitive cellulitis or lymphangitis.  Will send labs, CT head, CXR.  VSS for now but will get rectal temp.  Rectal temp noted at 103F.  U/a appears somewhat infectious with few bacteria, 11-20's WBCs.  Culture pending.  Code sepsis activated.  Patient given empiric [place] pending cultures.  Remainder of work-up pending at this time.   6:59 PM Called to patient's room as he is becoming increasingly agitated and combative. Patient was attempting to get out of bed, he was able to be coaxed back into bed. We have informed family that he has a fever and apparent UTI will need admission. Security responded as well.  Patient will be given small dose of Ativan to help with agitation.  8:05 PM Patient much more calm after ativan, resting in bed comfortably.  VS stable at this time.  IVF and abx infusing.  CT head negative for acute findings.  Lab work as above-- lactic acid 2.62, leukocytosis of 12.6.  ? AKI, no prior values for comparison.  Patient appears to be responding well to fluids.  Hospitalist, Dr. Julian Reil, will admit for further management.  He requested orthopedics to be consulted regarding possible infection of left knee given recent surgery.  I have spoken with Dr. Jerl Santos who is on call for [facility] orthopedics, patient will be seen in the morning.  Garlon Hatchet, PA-C 05/21/15 2352  Samuel Jester, DO 05/25/15 [phone]

## 2015-05-21 NOTE — Progress Notes (Signed)
Received report from Tori, RN in ED for transfer of pt to [phone].

## 2015-05-21 NOTE — Progress Notes (Addendum)
ANTIBIOTIC CONSULT NOTE - INITIAL  Pharmacy Consult for Ceftriaxone and Vancomycin Indication: Urosepsis and septic joint/wound infection  No Known Allergies  Patient Measurements: Height:  (188 cm) Weight: 250 lb (113.399 kg) IBW/kg (Calculated) : 82.2  Vital Signs: Temp: 98.3 F (36.8 C) (08/24 1706) Temp Source: Oral (08/24 1706) BP: 138/73 mmHg (08/24 1833) Pulse Rate: 88 (08/24 1833) Intake/Output from previous day:   Intake/Output from this shift:    Labs: No results for input(s): WBC, HGB, PLT, LABCREA, CREATININE in the last 72 hours. CrCl cannot be calculated (Patient has no serum creatinine result on file.). No results for input(s): VANCOTROUGH, VANCOPEAK, VANCORANDOM, GENTTROUGH, GENTPEAK, GENTRANDOM, TOBRATROUGH, TOBRAPEAK, TOBRARND, AMIKACINPEAK, AMIKACINTROU, AMIKACIN in the last 72 hours.   Microbiology: No results found for this or any previous visit (from the past 720 hour(s)).  Medical History: Past Medical History  Diagnosis Date  . MI (myocardial infarction) 2014    Medications:  Scheduled:  . acetaminophen  650 mg Rectal Once  . acetaminophen  650 mg Oral Once  . LORazepam  1 mg Intravenous Once   Assessment: Wayne Phillips with presenting with loss of consciousness. Pharmacy consulted to dose Ceftriaxone for urosepsis. WBC 12.6  Goal of Therapy:  Eradication of infection  Plan:  Start Ceftriaxone 2G Q24hr   Remi Haggard, PharmD Clinical Pharmacist- Resident Pager: [phone]  Remi Haggard 05/21/2015,7:01 PM  ADDN: Pharmacy is consulted to dose vancomycin for septic joint/wound infection.   Plan: Continue ceftriaxone 2g IV q24h Vancomycin 2g IV load followed by  q12h Monitor culture data, renal function and clinical course VT at [facility]. Newman Pies, PharmD Clinical Pharmacist Pager [phone]

## 2015-05-21 NOTE — Progress Notes (Signed)
Pt arrived to unit alert and oriented x2. Oriented to room, unit, and staff. Daughter at bedside.  Bed in lowest position and call bell is within reach. Will continue to monitor.

## 2015-05-22 DIAGNOSIS — A419 Sepsis, unspecified organism: Secondary | ICD-10-CM

## 2015-05-22 DIAGNOSIS — N390 Urinary tract infection, site not specified: Secondary | ICD-10-CM

## 2015-05-22 LAB — GLUCOSE, CAPILLARY
Glucose-Capillary: 105 mg/dL — ABNORMAL HIGH (ref 65–99)
Glucose-Capillary: 108 mg/dL — ABNORMAL HIGH (ref 65–99)
Glucose-Capillary: 131 mg/dL — ABNORMAL HIGH (ref 65–99)

## 2015-05-22 LAB — BASIC METABOLIC PANEL
Anion gap: 10 (ref 5–15)
BUN: 14 mg/dL (ref 6–20)
CALCIUM: 8.1 mg/dL — AB (ref 8.9–10.3)
CHLORIDE: 106 mmol/L (ref 101–111)
CO2: 19 mmol/L — AB (ref 22–32)
CREATININE: 1.17 mg/dL (ref 0.61–1.24)
GFR calc Af Amer: >60 mL/min (ref >60)
GFR calc non Af Amer: >60 mL/min (ref >60)
Glucose, Bld: 113 mg/dL — ABNORMAL HIGH (ref 65–99)
Potassium: 4.2 mmol/L (ref 3.5–5.1)
Sodium: 135 mmol/L (ref 135–145)

## 2015-05-22 LAB — CBC
HEMATOCRIT: 36 % — AB (ref 39.0–52.0)
HEMOGLOBIN: 12.1 g/dL — AB (ref 13.0–17.0)
MCH: 30.8 pg (ref 26.0–34.0)
MCHC: 33.6 g/dL (ref 30.0–36.0)
MCV: 91.6 fL (ref 78.0–100.0)
Platelets: 174 10*3/uL (ref 150–400)
RBC: 3.93 MIL/uL — ABNORMAL LOW (ref 4.22–5.81)
RDW: 12.5 % (ref 11.5–15.5)
WBC: 12 10*3/uL — ABNORMAL HIGH (ref 4.0–10.5)

## 2015-05-22 LAB — GRAM STAIN

## 2015-05-22 LAB — SYNOVIAL CELL COUNT + DIFF, W/ CRYSTALS
Crystals, Fluid: NONE SEEN
EOSINOPHILS-SYNOVIAL: 0 % (ref 0–1)
Lymphocytes-Synovial Fld: 14 % (ref 0–20)
Monocyte-Macrophage-Synovial Fluid: 41 % — ABNORMAL LOW (ref 50–90)
Neutrophil, Synovial: 45 % — ABNORMAL HIGH (ref 0–25)
WBC, SYNOVIAL: 556 /mm3 — AB (ref 0–200)

## 2015-05-22 LAB — LACTIC ACID, PLASMA: Lactic Acid, Venous: 1 mmol/L (ref 0.5–2.0)

## 2015-05-22 MED ORDER — INSULIN ASPART 100 UNIT/ML ~~LOC~~ SOLN: 0.0000 [IU] | Freq: Three times a day (TID) | SUBCUTANEOUS | Status: DC

## 2015-05-22 MED ORDER — LIDOCAINE HCL 2 % IJ SOLN
10.0000 mL | Freq: Once | INTRAMUSCULAR | Status: AC
  Administered 2015-05-22: 200 mg via INTRADERMAL
  Filled 2015-05-22: qty 10

## 2015-05-22 NOTE — Care Management Note (Addendum)
Case Management Note  Patient Details  Name: Warner Laduca MRN: [id number] Date of Birth: [birth year]  Subjective/Objective:       NCM spoke with patient, he lives with his spouse he gets around very well at home.  His PCP is Kirby Funk with [facility].    NCM will cont to follow for dc needs.           Action/Plan:   Expected Discharge Date:                  Expected Discharge Plan:  Home/Self Care  In-House Referral:     Discharge planning Services  CM Consult  Post Acute Care Choice:    Choice offered to:     DME Arranged:    DME Agency:     HH Arranged:    HH Agency:     Status of Service:  In process, will continue to follow  Medicare Important Message Given:    Date Medicare IM Given:    Medicare IM give by:    Date Additional Medicare IM Given:    Additional Medicare Important Message give by:     If discussed at Long Length of Stay Meetings, dates discussed:    Additional Comments:  Leone Haven, RN 05/22/2015, 11:36 AM

## 2015-05-22 NOTE — Progress Notes (Signed)
[facility] PROGRESS NOTE  Takao Lizer ZOX:[id number] DOB: 1941/08/01 DOA: 05/21/2015 PCP: No primary care provider on file.  Assessment/Plan: 1. Sepsis -source unclear, ?L knee vs UTI although urine not highly suggestive of infection -Continue Rocephin and Vancomycin -IVF, repeat lactic acid -FU Blood and Urine Cx -Orthopedics consulted, plan for Arthrocentesis today  2. DM -stable, SSI for now  3. CKD 3 -stable  4. H/o CAD -continue plavix, Toprol  DVt proph: Hep SQ  Code Status: Full Code Family Communication: daughter at bedside Disposition Plan: Home tomorrow   Consultants:  Orthopedics, [facility]  HPI/Subjective: Feels ok, tired   Objective: Filed Vitals:   05/22/15 1018  BP: 120/64  Pulse: 73  Temp:   Resp:     Intake/Output Summary (Last 24 hours) at 05/22/15 1220 Last data filed at 05/22/15 1054  Gross per 24 hour  Intake 3693.33 ml  Output   1300 ml  Net 2393.33 ml   Filed Weights   05/21/15 1706 05/21/15 2225  Weight: 113.399 kg (250 lb) 111.993 kg (246 lb 14.4 oz)    Exam:   General:  AAOx3, ill appearing  Cardiovascular: S1S2/RRR  Respiratory: CTAB  Abdomen: soft, NT, BS present  Musculoskeletal: L knee with swelling   Data Reviewed: Basic Metabolic Panel:  Recent Labs Lab 05/21/15 1845 05/22/15 0555  NA 135 135  K 4.4 4.2  CL 102 106  CO2 22 19*  GLUCOSE 138* 113*  BUN 18 14  CREATININE 1.43* 1.17  CALCIUM 9.2 8.1*   Liver Function Tests:  Recent Labs Lab 05/21/15 1845  AST 24  ALT 13*  ALKPHOS 62  BILITOT 2.0*  PROT 6.8  ALBUMIN 3.6   No results for input(s): LIPASE, AMYLASE in the last 168 hours.  Recent Labs Lab 05/21/15 1845  AMMONIA 20   CBC:  Recent Labs Lab 05/21/15 1845 05/22/15 0555  WBC 12.6* 12.0*  NEUTROABS 10.0*  --   HGB 13.7 12.1*  HCT 39.8 36.0*  MCV 90.2 91.6  PLT 208 174   Cardiac Enzymes: No results for input(s): CKTOTAL, CKMB, CKMBINDEX, TROPONINI in the  last 168 hours. BNP (last 3 results) No results for input(s): BNP in the last 8760 hours.  ProBNP (last 3 results) No results for input(s): PROBNP in the last 8760 hours.  CBG:  Recent Labs Lab 05/21/15 1716  GLUCAP 124*    Recent Results (from the past 240 hour(s))  Urine culture     Status: None (Preliminary result)   Collection Time: 05/21/15  5:35 PM  Result Value Ref Range Status   Specimen Description URINE, CLEAN CATCH  Final   Special Requests NONE  Final   Culture TOO YOUNG TO READ  Final   Report Status PENDING  Incomplete     Studies: Dg Chest 2 View  05/21/2015   CLINICAL DATA:  Altered mental status, tremors, falling asleep, not responding.  EXAM: CHEST  2 VIEW  COMPARISON:  None.  FINDINGS: Trachea is midline. Heart size is accentuated by low lung volumes and AP semi upright technique. Lungs are grossly clear. No pleural fluid. Old left rib fractures.  IMPRESSION: Low lung volumes.  No acute findings.   Electronically Signed   By: Leanna Battles M.D.   On: 05/21/2015 18:04   Ct Head Wo Contrast  05/21/2015   CLINICAL DATA:  Altered mental status. Confusion. Decreased level of responsiveness. Possible loss of consciousness but no fall or trauma.  EXAM: CT HEAD WITHOUT CONTRAST  TECHNIQUE: Contiguous  axial images were obtained from the base of the skull through the vertex without intravenous contrast.  COMPARISON:  None.  FINDINGS: Mild diffuse cerebral atrophy. Patchy low-attenuation changes in the deep white matter consistent with mild small vessel ischemic change. Mild ventricular dilatation consistent with central atrophy. No mass effect or midline shift. No abnormal extra-axial fluid collections. Gray-white matter junctions are distinct. Basal cisterns are not effaced. No evidence of acute intracranial hemorrhage. No depressed skull fractures. Retention cyst and mucosal thickening in the left maxillary antrum. Mastoid air cells are not opacified.  IMPRESSION: No acute  intracranial abnormalities. Mild chronic atrophy and small vessel ischemic change.   Electronically Signed   By: Burman Nieves M.D.   On: 05/21/2015 18:22    Scheduled Meds: . cefTRIAXone (ROCEPHIN)  IV  2 g Intravenous Q24H  . clopidogrel  75 mg Oral Daily  . docusate sodium  100 mg Oral QHS  . heparin  5,000 Units Subcutaneous 3 times per day  . lidocaine  10 mL Intradermal Once  . metoprolol succinate  25 mg Oral Daily  . sodium chloride  3 mL Intravenous Q12H  . vancomycin  750 mg Intravenous Q12H   Continuous Infusions: . sodium chloride 100 mL/hr at 05/21/15 2125   Antibiotics Given (last 72 hours)    Date/Time Action Medication Dose Rate   05/22/15 0814 Given   vancomycin (VANCOCIN) IVPB 750 mg/150 ml premix 750 mg 150 mL/hr      Principal Problem:   Sepsis Active Problems:   Pyuria    Time spent:    [facility]  [facility] Pager [phone]. If 7PM-7AM, please contact night-coverage at [email or web address], password [facility] 05/22/2015, 12:20 PM  LOS: 1 day

## 2015-05-22 NOTE — Progress Notes (Signed)
Utilization Review completed. Lahari Suttles RN BSN CM 

## 2015-05-22 NOTE — Progress Notes (Signed)
The patient is 13 days status post left knee arthroscopy.  He is admitted to the hospital with some confusion and a urinary tract infection/sepsis.  I saw the patient this morning.  He has a moderate effusion.  I don't think he has a septic knee.  I will come over early in the afternoon and aspirate his left knee.  A full note will follow.

## 2015-05-22 NOTE — Progress Notes (Signed)
Subjective: The patient is feeling much better this evening.  He is up walking.  He has bending and moving the knee without complaints.  He is not certain exactly what happened to him but at this point he feels dramatically better.   Objective: Vital signs in last 24 hours: Temp:  [98.9 F (37.2 C)-100 F (37.8 C)] 99 F (37.2 C) (08/25 2216) Pulse Rate:  [71-84] 71 (08/25 2216) Resp:  [16-20] 20 (08/25 2216) BP: (114-137)/(41-68) 137/63 mmHg (08/25 2216) SpO2:  [97 %-99 %] 97 % (08/25 2216)  Intake/Output from previous day: 08/24 0701 - 08/25 0700 In: 3693.3 [P.O.:240; I.V.:3453.3] Out: 900 [Urine:900] Intake/Output this shift:     Recent Labs  05/21/15 1845 05/22/15 0555  HGB 13.7 12.1*    Recent Labs  05/21/15 1845 05/22/15 0555  WBC 12.6* 12.0*  RBC 4.41 3.93*  HCT 39.8 36.0*  PLT 208 174    Recent Labs  05/21/15 1845 05/22/15 0555  NA 135 135  K 4.4 4.2  CL 102 106  CO2 22 19*  BUN 18 14  CREATININE 1.43* 1.17  GLUCOSE 138* 113*  CALCIUM 9.2 8.1*   No results for input(s): LABPT, INR in the last 72 hours.  Neurologically intact ABD soft Dorsiflexion/Plantar flexion intact No cellulitis present Compartment soft kknee has minimal pain on range of motion.  There is no erythema.  There is no warmth.  There is no tenderness.  Assessment/Plan: 74 year old male with suspected urinary tract infection status post arthroscopy with mild hemarthrosis.//The patient has no physical exam signs or laboratory signs of infection in the knee.  He needs continuation of care per his medical team and we will follow him in the office in a couple of weeks.  We're obviously available should the situation change but I do not think he has any infectious issues related to the left knee.   Charitie Hinote L 05/22/2015, 10:57 PM

## 2015-05-22 NOTE — Consult Note (Signed)
Reason for Consult: Left Knee swelling Referring Physician: [facility]  Wayne Phillips is an 74 y.o. male.  HPI: The patient is a 74 year old male who is status post left knee arthroscopy by Dr. Berenice Primas with excision of a torn medial meniscus. He is approximately 2 weeks status post arthroscopy. He presented to the office 2 days ago with increased knee pain. He left the office before being seen by the physician. He then presented to the emergency room with significant confusion and fever. He reports today that he has minimal to moderate knee pain, but does have persistent and significant swelling in his left knee. We are asked by the [facility] to evaluate his left knee as a potential source for his sepsis.  Past Medical History  Diagnosis Date  . MI (myocardial infarction) 2014  . DM (diabetes mellitus)   . HLD (hyperlipidemia)   . CKD (chronic kidney disease) stage 3, GFR 30-59 ml/min     Past Surgical History  Procedure Laterality Date  . Knee arthroscopy w/ acl reconstruction      left    History reviewed. No pertinent family history.  Social History:  reports that he has quit smoking. He has never used smokeless tobacco. He reports that he does not drink alcohol or use illicit drugs.  Allergies:  Allergies  Allergen Reactions  . Novocain [Procaine]     Unknown per daughter, hasnt had since he was a child, is listed on allergy list on other chart that is being merged into this one.  . Statins     Myalgias with crestor and zocor and pravachol    Medications:  Current facility-administered medications:  .  0.9 %  sodium chloride infusion, , Intravenous, Continuous, Etta Quill, DO, Last Rate: 100 mL/hr at 05/21/15 2125 .  acetaminophen (TYLENOL) tablet 1,000 mg, 1,000 mg, Oral, Q6H PRN, Etta Quill, DO .  cefTRIAXone (ROCEPHIN) 2 g in dextrose 5 % 50 mL IVPB, 2 g, Intravenous, Q24H, Darl Pikes, RPH .  clopidogrel (PLAVIX) tablet 75 mg, 75 mg, Oral,  Daily, Etta Quill, DO, 75 mg at 05/22/15 1018 .  docusate sodium (COLACE) capsule 100 mg, 100 mg, Oral, QHS, Jared M Gardner, DO, 100 mg at 05/21/15 2241 .  heparin injection 5,000 Units, 5,000 Units, Subcutaneous, 3 times per day, Etta Quill, DO, 5,000 Units at 05/22/15 [phone] .  insulin aspart (novoLOG) injection 0-9 Units, 0-9 Units, Subcutaneous, TID WC, Domenic Polite, MD .  LORazepam (ATIVAN) injection 1 mg, 1 mg, Intravenous, Q6H PRN, Rhetta Mura Schorr, NP, 1 mg at 05/21/15 2347 .  metoprolol succinate (TOPROL-XL) 24 hr tablet 25 mg, 25 mg, Oral, Daily, Etta Quill, DO, 25 mg at 05/22/15 1018 .  oxyCODONE-acetaminophen (PERCOCET/ROXICET) 5-325 MG per tablet 1 tablet, 1 tablet, Oral, Q6H PRN, Etta Quill, DO .  sodium chloride 0.9 % injection 3 mL, 3 mL, Intravenous, Q12H, Jared M Gardner, DO, 3 mL at 05/22/15 1021 .  traMADol (ULTRAM) tablet 50 mg, 50 mg, Oral, Daily PRN, Etta Quill, DO .  [COMPLETED] vancomycin (VANCOCIN) 2,000 mg in sodium chloride 0.9 % 500 mL IVPB, 2,000 mg, Intravenous, Once, Last Rate: 250 mL/hr at 05/21/15 2120, 2,000 mg at 05/21/15 2120 **FOLLOWED BY** vancomycin (VANCOCIN) IVPB 750 mg/150 ml premix, 750 mg, Intravenous, Q12H, Rebecka Apley, RPH, 750 mg at 05/22/15 4010  Results for orders placed or performed during the hospital encounter of 05/21/15 (from the past 48 hour(s))  CBG monitoring, ED  Status: Abnormal   Collection Time: 05/21/15  5:16 PM  Result Value Ref Range   Glucose-Capillary 124 (H) 65 - 99 mg/dL  Urine rapid drug screen (hosp performed)     Status: Abnormal   Collection Time: 05/21/15  5:35 PM  Result Value Ref Range   Opiates POSITIVE (A) NONE DETECTED   Cocaine NONE DETECTED NONE DETECTED   Benzodiazepines NONE DETECTED NONE DETECTED   Amphetamines NONE DETECTED NONE DETECTED   Tetrahydrocannabinol NONE DETECTED NONE DETECTED   Barbiturates NONE DETECTED NONE DETECTED    Comment:        DRUG SCREEN FOR MEDICAL  PURPOSES ONLY.  IF CONFIRMATION IS NEEDED FOR ANY PURPOSE, NOTIFY LAB WITHIN 5 DAYS.        LOWEST DETECTABLE LIMITS FOR URINE DRUG SCREEN Drug Class       Cutoff (ng/mL) Amphetamine      1000 Barbiturate      200 Benzodiazepine   027 Tricyclics       741 Opiates          300 Cocaine          300 THC              50   Urinalysis, Routine w reflex microscopic (not at [facility])     Status: Abnormal   Collection Time: 05/21/15  5:35 PM  Result Value Ref Range   Color, Urine AMBER (A) YELLOW    Comment: BIOCHEMICALS MAY BE AFFECTED BY COLOR   APPearance CLEAR CLEAR   Specific Gravity, Urine 1.025 1.005 - 1.030   pH 5.5 5.0 - 8.0   Glucose, UA NEGATIVE NEGATIVE mg/dL   Hgb urine dipstick NEGATIVE NEGATIVE   Bilirubin Urine NEGATIVE NEGATIVE   Ketones, ur 15 (A) NEGATIVE mg/dL   Protein, ur 30 (A) NEGATIVE mg/dL   Urobilinogen, UA 1.0 0.0 - 1.0 mg/dL   Nitrite NEGATIVE NEGATIVE   Leukocytes, UA SMALL (A) NEGATIVE  Urine microscopic-add on     Status: Abnormal   Collection Time: 05/21/15  5:35 PM  Result Value Ref Range   Squamous Epithelial / LPF RARE RARE   WBC, UA 11-20 <3 WBC/hpf   Bacteria, UA FEW (A) RARE   Sperm, UA PRESENT    Urine-Other MUCOUS PRESENT   Urine culture     Status: None (Preliminary result)   Collection Time: 05/21/15  5:35 PM  Result Value Ref Range   Specimen Description URINE, CLEAN CATCH    Special Requests NONE    Culture TOO YOUNG TO READ    Report Status PENDING   CBC with Differential     Status: Abnormal   Collection Time: 05/21/15  6:45 PM  Result Value Ref Range   WBC 12.6 (H) 4.0 - 10.5 K/uL   RBC 4.41 4.22 - 5.81 MIL/uL   Hemoglobin 13.7 13.0 - 17.0 g/dL   HCT 39.8 39.0 - 52.0 %   MCV 90.2 78.0 - 100.0 fL   MCH 31.1 26.0 - 34.0 pg   MCHC 34.4 30.0 - 36.0 g/dL   RDW 12.6 11.5 - 15.5 %   Platelets 208 150 - 400 K/uL   Neutrophils Relative % 80 (H) 43 - 77 %   Neutro Abs 10.0 (H) 1.7 - 7.7 K/uL   Lymphocytes Relative 9 (L) 12 - 46 %    Lymphs Abs 1.1 0.7 - 4.0 K/uL   Monocytes Relative 11 3 - 12 %   Monocytes Absolute 1.4 (H) 0.1 - 1.0 K/uL  Eosinophils Relative 0 0 - 5 %   Eosinophils Absolute 0.0 0.0 - 0.7 K/uL   Basophils Relative 0 0 - 1 %   Basophils Absolute 0.0 0.0 - 0.1 K/uL  Comprehensive metabolic panel     Status: Abnormal   Collection Time: 05/21/15  6:45 PM  Result Value Ref Range   Sodium 135 135 - 145 mmol/L   Potassium 4.4 3.5 - 5.1 mmol/L   Chloride 102 101 - 111 mmol/L   CO2 22 22 - 32 mmol/L   Glucose, Bld 138 (H) 65 - 99 mg/dL   BUN 18 6 - 20 mg/dL   Creatinine, Ser 1.43 (H) 0.61 - 1.24 mg/dL   Calcium 9.2 8.9 - 10.3 mg/dL   Total Protein 6.8 6.5 - 8.1 g/dL   Albumin 3.6 3.5 - 5.0 g/dL   AST 24 15 - 41 U/L   ALT 13 (L) 17 - 63 U/L   Alkaline Phosphatase 62 38 - 126 U/L   Total Bilirubin 2.0 (H) 0.3 - 1.2 mg/dL   GFR calc non Af Amer 47 (L) >60 mL/min   GFR calc Af Amer 55 (L) >60 mL/min    Comment: (NOTE) The eGFR has been calculated using the CKD EPI equation. This calculation has not been validated in all clinical situations. eGFR's persistently <60 mL/min signify possible Chronic Kidney Disease.    Anion gap 11 5 - 15  Ethanol     Status: None   Collection Time: 05/21/15  6:45 PM  Result Value Ref Range   Alcohol, Ethyl (B) <5 <5 mg/dL    Comment:        LOWEST DETECTABLE LIMIT FOR SERUM ALCOHOL IS 5 mg/dL FOR MEDICAL PURPOSES ONLY   Ammonia     Status: None   Collection Time: 05/21/15  6:45 PM  Result Value Ref Range   Ammonia 20 9 - 35 umol/L  D-dimer, quantitative (not at [facility])     Status: Abnormal   Collection Time: 05/21/15  6:45 PM  Result Value Ref Range   D-Dimer, Quant 1.79 (H) 0.00 - 0.48 ug/mL-FEU    Comment:        AT THE INHOUSE ESTABLISHED CUTOFF VALUE OF 0.48 ug/mL FEU, THIS ASSAY HAS BEEN DOCUMENTED IN THE LITERATURE TO HAVE A SENSITIVITY AND NEGATIVE PREDICTIVE VALUE OF AT LEAST 98 TO 99%.  THE TEST RESULT SHOULD BE CORRELATED WITH AN ASSESSMENT  OF THE CLINICAL PROBABILITY OF DVT / VTE.   I-stat troponin, ED     Status: None   Collection Time: 05/21/15  6:49 PM  Result Value Ref Range   Troponin i, poc 0.00 0.00 - 0.08 ng/mL   Comment 3            Comment: Due to the release kinetics of cTnI, a negative result within the first hours of the onset of symptoms does not rule out myocardial infarction with certainty. If myocardial infarction is still suspected, repeat the test at appropriate intervals.   Culture, blood (routine x 2)     Status: None (Preliminary result)   Collection Time: 05/21/15  6:50 PM  Result Value Ref Range   Specimen Description BLOOD RIGHT HAND    Special Requests BOTTLES DRAWN AEROBIC AND ANAEROBIC 5CC    Culture NO GROWTH < 24 HOURS    Report Status PENDING   Culture, blood (routine x 2)     Status: None (Preliminary result)   Collection Time: 05/21/15  6:55 PM  Result Value Ref Range  Specimen Description BLOOD LEFT HAND    Special Requests BOTTLES DRAWN AEROBIC ONLY 5CC    Culture NO GROWTH < 24 HOURS    Report Status PENDING   I-Stat CG4 Lactic Acid, ED  (not at  [facility])     Status: Abnormal   Collection Time: 05/21/15  7:20 PM  Result Value Ref Range   Lactic Acid, Venous 2.62 (HH) 0.5 - 2.0 mmol/L   Comment NOTIFIED PHYSICIAN   I-Stat CG4 Lactic Acid, ED  (not at  [facility])     Status: Abnormal   Collection Time: 05/21/15  9:48 PM  Result Value Ref Range   Lactic Acid, Venous 2.25 (HH) 0.5 - 2.0 mmol/L   Comment NOTIFIED PHYSICIAN   CBC     Status: Abnormal   Collection Time: 05/22/15  5:55 AM  Result Value Ref Range   WBC 12.0 (H) 4.0 - 10.5 K/uL   RBC 3.93 (L) 4.22 - 5.81 MIL/uL   Hemoglobin 12.1 (L) 13.0 - 17.0 g/dL   HCT 36.0 (L) 39.0 - 52.0 %   MCV 91.6 78.0 - 100.0 fL   MCH 30.8 26.0 - 34.0 pg   MCHC 33.6 30.0 - 36.0 g/dL   RDW 12.5 11.5 - 15.5 %   Platelets 174 150 - 400 K/uL  Basic metabolic panel     Status: Abnormal   Collection Time: 05/22/15  5:55 AM  Result Value Ref  Range   Sodium 135 135 - 145 mmol/L   Potassium 4.2 3.5 - 5.1 mmol/L   Chloride 106 101 - 111 mmol/L   CO2 19 (L) 22 - 32 mmol/L   Glucose, Bld 113 (H) 65 - 99 mg/dL   BUN 14 6 - 20 mg/dL   Creatinine, Ser 1.17 0.61 - 1.24 mg/dL   Calcium 8.1 (L) 8.9 - 10.3 mg/dL   GFR calc non Af Amer >60 >60 mL/min   GFR calc Af Amer >60 >60 mL/min    Comment: (NOTE) The eGFR has been calculated using the CKD EPI equation. This calculation has not been validated in all clinical situations. eGFR's persistently <60 mL/min signify possible Chronic Kidney Disease.    Anion gap 10 5 - 15  Lactic acid, plasma     Status: None   Collection Time: 05/22/15 11:30 AM  Result Value Ref Range   Lactic Acid, Venous 1.0 0.5 - 2.0 mmol/L  Glucose, capillary     Status: Abnormal   Collection Time: 05/22/15  1:03 PM  Result Value Ref Range   Glucose-Capillary 105 (H) 65 - 99 mg/dL    Dg Chest 2 View  05/21/2015   CLINICAL DATA:  Altered mental status, tremors, falling asleep, not responding.  EXAM: CHEST  2 VIEW  COMPARISON:  None.  FINDINGS: Trachea is midline. Heart size is accentuated by low lung volumes and AP semi upright technique. Lungs are grossly clear. No pleural fluid. Old left rib fractures.  IMPRESSION: Low lung volumes.  No acute findings.   Electronically Signed   By: Lorin Picket M.D.   On: 05/21/2015 18:04   Ct Head Wo Contrast  05/21/2015   CLINICAL DATA:  Altered mental status. Confusion. Decreased level of responsiveness. Possible loss of consciousness but no fall or trauma.  EXAM: CT HEAD WITHOUT CONTRAST  TECHNIQUE: Contiguous axial images were obtained from the base of the skull through the vertex without intravenous contrast.  COMPARISON:  None.  FINDINGS: Mild diffuse cerebral atrophy. Patchy low-attenuation changes in the deep white matter consistent  with mild small vessel ischemic change. Mild ventricular dilatation consistent with central atrophy. No mass effect or midline shift. No  abnormal extra-axial fluid collections. Gray-white matter junctions are distinct. Basal cisterns are not effaced. No evidence of acute intracranial hemorrhage. No depressed skull fractures. Retention cyst and mucosal thickening in the left maxillary antrum. Mastoid air cells are not opacified.  IMPRESSION: No acute intracranial abnormalities. Mild chronic atrophy and small vessel ischemic change.   Electronically Signed   By: Lucienne Capers M.D.   On: 05/21/2015 18:22    ROS   Neg as relates to knee Blood pressure 114/41, pulse 72, temperature 98.9 F (37.2 C), temperature source Oral, resp. rate 16, height 6' 3"  (1.905 m), weight 111.993 kg (246 lb 14.4 oz), SpO2 99 %. Physical Exam     left knee exam: +1.5 effusion. Range of motion 0 to 110 of flexion without significant pain. Mild warmth. Arthroscopic incisions are benign without sign of infection. Calf is soft and nontender. Minimal pain if any through range of motion.   BP 114/41 mmHg  Pulse 72  Temp(Src) 98.9 F (37.2 C) (Oral)  Resp 16  Ht 6' 3"  (1.905 m)  Wt 111.993 kg (246 lb 14.4 oz)  BMI 30.86 kg/m2  SpO2 99%  General Appearance:    Alert, cooperative, no distress, appears stated age  Head:    Normocephalic, without obvious abnormality, atraumatic  Eyes:    PERRL, conjunctiva/corneas clear, EOM's intact, fundi    benign, both eyes       Ears:    Normal TM's and external ear canals, both ears  Nose:   Nares normal, septum midline, mucosa normal, no drainage   or sinus tenderness  Throat:   Lips, mucosa, and tongue normal; teeth and gums normal  Neck:   Supple, symmetrical, trachea midline, no adenopathy;       thyroid:  No enlargement/tenderness/nodules; no carotid   bruit or JVD  Back:     Symmetric, no curvature, ROM normal, no CVA tenderness  Lungs:     Clear to auscultation bilaterally, respirations unlabored  Chest wall:    No tenderness or deformity  Heart:    Regular rate and rhythm, S1 and S2 normal, no  murmur, rub   or gallop  Abdomen:     Soft, non-tender, bowel sounds active all four quadrants,    no masses, no organomegaly        Extremities:     Pulses:   2+ and symmetric all extremities  Skin:   Skin color, texture, turgor normal, no rashes or lesions  Lymph nodes:   Cervical, supraclavicular, and axillary nodes normal          Assessment/Plan: !.Moderate left knee effusion s/p knee arthroscopy on 05/09/15.Doubt septic left knee Plan:I will aspirate the left knee today. Will send synovial fluid for stat gm stain,synovial fluid analysis/cellcounts/crystals,and aerobic/anaerobic cx's.  Procedure: Under sterile conditions after painting the superior lateral portal region of the left knee with 5 mL of 2% plain lidocaine I aspirated 35 mL of nonpurulent blood tinged synovial fluid from the left knee. There was no frank evidence of purulence whatsoever. We will send this to the lab for stat Gram stain, synovial fluid analysis/cell counts/crystals and cultures. We will follow-up with the patient in the a.m.  [place] G 05/22/2015, 3:11 PM

## 2015-05-23 LAB — BASIC METABOLIC PANEL
ANION GAP: 10 (ref 5–15)
BUN: 14 mg/dL (ref 6–20)
CALCIUM: 8.3 mg/dL — AB (ref 8.9–10.3)
CO2: 19 mmol/L — ABNORMAL LOW (ref 22–32)
Chloride: 105 mmol/L (ref 101–111)
Creatinine, Ser: 1.16 mg/dL (ref 0.61–1.24)
GFR calc Af Amer: >60 mL/min (ref >60)
GLUCOSE: 142 mg/dL — AB (ref 65–99)
Potassium: 3.8 mmol/L (ref 3.5–5.1)
SODIUM: 134 mmol/L — AB (ref 135–145)

## 2015-05-23 LAB — GLUCOSE, CAPILLARY
Glucose-Capillary: 106 mg/dL — ABNORMAL HIGH (ref 65–99)
Glucose-Capillary: 159 mg/dL — ABNORMAL HIGH (ref 65–99)
Glucose-Capillary: 152 mg/dL — ABNORMAL HIGH (ref 65–99)

## 2015-05-23 LAB — CBC
HCT: 36.6 % — ABNORMAL LOW (ref 39.0–52.0)
Hemoglobin: 12.7 g/dL — ABNORMAL LOW (ref 13.0–17.0)
MCH: 31.4 pg (ref 26.0–34.0)
MCHC: 34.7 g/dL (ref 30.0–36.0)
MCV: 90.6 fL (ref 78.0–100.0)
PLATELETS: 190 10*3/uL (ref 150–400)
RBC: 4.04 MIL/uL — ABNORMAL LOW (ref 4.22–5.81)
RDW: 12.5 % (ref 11.5–15.5)
WBC: 8.3 10*3/uL (ref 4.0–10.5)

## 2015-05-23 MED ORDER — ZOLPIDEM TARTRATE 5 MG PO TABS
5.0000 mg | ORAL_TABLET | Freq: Every evening | ORAL | Status: DC | PRN
  Administered 2015-05-23: 5 mg via ORAL
  Filled 2015-05-23: qty 1

## 2015-05-23 MED ORDER — ZOLPIDEM TARTRATE 5 MG PO TABS
5.0000 mg | ORAL_TABLET | Freq: Once | ORAL | Status: AC
  Administered 2015-05-23: 5 mg via ORAL
  Filled 2015-05-23: qty 1

## 2015-05-23 NOTE — Care Management Important Message (Signed)
Important Message  Patient Details  Name: Wayne Phillips MRN: [id number] Date of Birth: 1941-02-11   Medicare Important Message Given:  Yes-second notification given    Leone Haven, RN 05/23/2015, 11:10 AMImportant Message  Patient Details  Name: Wayne Phillips MRN: [id number] Date of Birth: 04-Dec-1940   Medicare Important Message Given:  Yes-second notification given    Leone Haven, RN 05/23/2015, 11:09 AM

## 2015-05-23 NOTE — Progress Notes (Signed)
Subjective: The patient has some swelling in his left knee, but only moderate to minimal pain.Some tightness with flexion.overall knee feels better.  Objective: Vital signs in last 24 hours: Temp:  [99 F (37.2 C)-99.7 F (37.6 C)] 99.4 F (37.4 C) (08/26 1344) Pulse Rate:  [71-73] 73 (08/26 1344) Resp:  [16-20] 16 (08/26 0555) BP: (137-147)/(63-73) 138/73 mmHg (08/26 1344) SpO2:  [96 %-99 %] 96 % (08/26 1344)  Intake/Output from previous day: 08/25 0701 - 08/26 0700 In: 2796.7 [P.O.:220; I.V.:2276.7; IV Piggyback:300] Out: 1000 [Urine:1000] Intake/Output this shift: Total I/O In: 0  Out: 200 [Urine:200]   Recent Labs  05/21/15 1845 05/22/15 0555 05/23/15 1055  HGB 13.7 12.1* 12.7*    Recent Labs  05/22/15 0555 05/23/15 1055  WBC 12.0* 8.3  RBC 3.93* 4.04*  HCT 36.0* 36.6*  PLT 174 190    Recent Labs  05/22/15 0555 05/23/15 1055  NA 135 134*  K 4.2 3.8  CL 106 105  CO2 19* 19*  BUN 14 14  CREATININE 1.17 1.16  GLUCOSE 113* 142*  CALCIUM 8.1* 8.3*   No results for input(s): LABPT, INR in the last 72 hours. Gram stain of left knee aspirate was negative for organisms. No view of fluid analysis/cell count/crystals showed no crystals. No increased WBCs. Cultures currently pending. Urine culture positive for greater than 100,000 colonies of Escherichia coli. Left knee exam: He has a moderate left knee effusion. . Range of motion is 0-105 of flexion. The patient's calf is soft and nontender. His arthroscopic incisions are still benign.no significant knee tenderness.   Assessment/Plan: 1. Expected left knee effusion 2 weeks status post left knee arthroscopy. No clinical or laboratory evidence of septic arthritis. Plan: Encouraged ice for left knee I showed him some quadriceps strengthening exercises. He will work on range of motion. He may weight-bear as tolerated on the left knee. We will see him back the first part of the week as his daughters will probably  be taking him out of town to either [place] or  [place]. He may need a repeat aspiration on an outpatient basis at that time. Follow-up with Dr. Luiz Blare this coming Monday or Tuesday in the office.  Nicolet Griffy G 05/23/2015, 1:56 PM

## 2015-05-23 NOTE — Progress Notes (Signed)
[facility] PROGRESS NOTE  Wayne Phillips ZOX:[id number] DOB: 05-18-1941 DOA: 05/21/2015 PCP: No primary care provider on file.  Assessment/Plan: 1. 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 DM -stable, SSI for now  3. CKD 3 -stable  4. H/o CAD -continue plavix, Toprol  DVt proph: Hep SQ  Code Status: Full Code Family Communication: daughter at bedside Disposition Plan: Home tomorrow if stable   Consultants:  Orthopedics, [facility]  HPI/Subjective: Looks better, no distress  Objective: Filed Vitals:   05/23/15 1344  BP: 138/73  Pulse: 73  Temp: 99.4 F (37.4 C)  Resp:     Intake/Output Summary (Last 24 hours) at 05/23/15 1412 Last data filed at 05/23/15 1350  Gross per 24 hour  Intake 2796.67 ml  Output    800 ml  Net 1996.67 ml   Filed Weights   05/21/15 1706 05/21/15 2225  Weight: 113.399 kg (250 lb) 111.993 kg (246 lb 14.4 oz)    Exam:   General:  AAOx3, no distress  Cardiovascular: S1S2/RRR  Respiratory: CTAB  Abdomen: soft, NT, BS present  Musculoskeletal: L knee with swelling improving  Data Reviewed: Basic Metabolic Panel:  Recent Labs Lab 05/21/15 1845 05/22/15 0555 05/23/15 1055  NA 135 135 134*  K 4.4 4.2 3.8  CL 102 106 105  CO2 22 19* 19*  GLUCOSE 138* 113* 142*  BUN 18 14 14   CREATININE 1.43* 1.17 1.16  CALCIUM 9.2 8.1* 8.3*   Liver Function Tests:  Recent Labs Lab 05/21/15 1845  AST 24  ALT 13*  ALKPHOS 62  BILITOT 2.0*  PROT 6.8  ALBUMIN 3.6   No results for input(s): LIPASE, AMYLASE in the last 168 hours.  Recent Labs Lab 05/21/15 1845  AMMONIA 20   CBC:  Recent Labs Lab 05/21/15 1845 05/22/15 0555 05/23/15 1055  WBC 12.6* 12.0* 8.3  NEUTROABS 10.0*  --   --   HGB 13.7 12.1* 12.7*   HCT 39.8 36.0* 36.6*  MCV 90.2 91.6 90.6  PLT 208 174 190   Cardiac Enzymes: No results for input(s): CKTOTAL, CKMB, CKMBINDEX, TROPONINI in the last 168 hours. BNP (last 3 results) No results for input(s): BNP in the last 8760 hours.  ProBNP (last 3 results) No results for input(s): PROBNP in the last 8760 hours.  CBG:  Recent Labs Lab 05/22/15 1303 05/22/15 1646 05/22/15 2213 05/23/15 0756 05/23/15 1154  GLUCAP 105* 108* 131* 106* 159*    Recent Results (from the past 240 hour(s))  Urine culture     Status: None (Preliminary result)   Collection Time: 05/21/15  5:35 PM  Result Value Ref Range Status   Specimen Description URINE, CLEAN CATCH  Final   Special Requests NONE  Final   Culture >=100,000 COLONIES/mL ESCHERICHIA COLI  Final   Report Status PENDING  Incomplete  Culture, blood (routine x 2)     Status: None (Preliminary result)   Collection Time: 05/21/15  6:50 PM  Result Value Ref Range Status   Specimen Description BLOOD RIGHT HAND  Final   Special Requests BOTTLES DRAWN AEROBIC AND ANAEROBIC 5CC  Final   Culture NO GROWTH < 24 HOURS  Final   Report Status PENDING  Incomplete  Culture, blood (routine x 2)     Status: None (Preliminary result)   Collection Time: 05/21/15  6:55  PM  Result Value Ref Range Status   Specimen Description BLOOD LEFT HAND  Final   Special Requests BOTTLES DRAWN AEROBIC ONLY 5CC  Final   Culture NO GROWTH < 24 HOURS  Final   Report Status PENDING  Incomplete  Gram stain     Status: None   Collection Time: 05/22/15  3:17 PM  Result Value Ref Range Status   Specimen Description SYNOVIAL LEFT KNEE  Final   Special Requests NONE  Final   Gram Stain   Final    MODERATE WBC PRESENT,BOTH PMN AND MONONUCLEAR NO ORGANISMS SEEN    Report Status 05/22/2015 FINAL  Final     Studies: Dg Chest 2 View  05/21/2015   CLINICAL DATA:  Altered mental status, tremors, falling asleep, not responding.  EXAM: CHEST  2 VIEW  COMPARISON:  None.   FINDINGS: Trachea is midline. Heart size is accentuated by low lung volumes and AP semi upright technique. Lungs are grossly clear. No pleural fluid. Old left rib fractures.  IMPRESSION: Low lung volumes.  No acute findings.   Electronically Signed   By: Leanna Battles M.D.   On: 05/21/2015 18:04   Ct Head Wo Contrast  05/21/2015   CLINICAL DATA:  Altered mental status. Confusion. Decreased level of responsiveness. Possible loss of consciousness but no fall or trauma.  EXAM: CT HEAD WITHOUT CONTRAST  TECHNIQUE: Contiguous axial images were obtained from the base of the skull through the vertex without intravenous contrast.  COMPARISON:  None.  FINDINGS: Mild diffuse cerebral atrophy. Patchy low-attenuation changes in the deep white matter consistent with mild small vessel ischemic change. Mild ventricular dilatation consistent with central atrophy. No mass effect or midline shift. No abnormal extra-axial fluid collections. Gray-white matter junctions are distinct. Basal cisterns are not effaced. No evidence of acute intracranial hemorrhage. No depressed skull fractures. Retention cyst and mucosal thickening in the left maxillary antrum. Mastoid air cells are not opacified.  IMPRESSION: No acute intracranial abnormalities. Mild chronic atrophy and small vessel ischemic change.   Electronically Signed   By: Burman Nieves M.D.   On: 05/21/2015 18:22    Scheduled Meds: . cefTRIAXone (ROCEPHIN)  IV  2 g Intravenous Q24H  . clopidogrel  75 mg Oral Daily  . docusate sodium  100 mg Oral QHS  . heparin  5,000 Units Subcutaneous 3 times per day  . insulin aspart  0-9 Units Subcutaneous TID WC  . metoprolol succinate  25 mg Oral Daily  . sodium chloride  3 mL Intravenous Q12H  . vancomycin  750 mg Intravenous Q12H   Continuous Infusions:   Antibiotics Given (last 72 hours)    Date/Time Action Medication Dose Rate   05/22/15 0814 Given   vancomycin (VANCOCIN) IVPB 750 mg/150 ml premix 750 mg 150 mL/hr    05/22/15 1751 Given   cefTRIAXone (ROCEPHIN) 2 g in dextrose 5 % 50 mL IVPB 2 g 100 mL/hr   05/22/15 2120 Given   vancomycin (VANCOCIN) IVPB 750 mg/150 ml premix 750 mg 150 mL/hr   05/23/15 0948 Given   vancomycin (VANCOCIN) IVPB 750 mg/150 ml premix 750 mg 150 mL/hr      Principal Problem:   Sepsis Active Problems:   Pyuria    Time spent:    [facility]  [facility] Pager [phone]. If 7PM-7AM, please contact night-coverage at [email or web address], password [facility] 05/23/2015, 2:12 PM  LOS: 2 days

## 2015-05-24 DIAGNOSIS — R509 Fever, unspecified: Secondary | ICD-10-CM | POA: Insufficient documentation

## 2015-05-24 LAB — CBC
HCT: 34.7 % — ABNORMAL LOW (ref 39.0–52.0)
Hemoglobin: 11.8 g/dL — ABNORMAL LOW (ref 13.0–17.0)
MCH: 30.3 pg (ref 26.0–34.0)
MCHC: 34 g/dL (ref 30.0–36.0)
MCV: 89 fL (ref 78.0–100.0)
Platelets: 218 10*3/uL (ref 150–400)
RBC: 3.9 MIL/uL — ABNORMAL LOW (ref 4.22–5.81)
RDW: 12.5 % (ref 11.5–15.5)
WBC: 5.8 10*3/uL (ref 4.0–10.5)

## 2015-05-24 LAB — BASIC METABOLIC PANEL
Anion gap: 11 (ref 5–15)
BUN: 13 mg/dL (ref 6–20)
CO2: 22 mmol/L (ref 22–32)
Calcium: 8.2 mg/dL — ABNORMAL LOW (ref 8.9–10.3)
Chloride: 102 mmol/L (ref 101–111)
Creatinine, Ser: 1.2 mg/dL (ref 0.61–1.24)
GFR calc Af Amer: >60 mL/min (ref >60)
GFR calc non Af Amer: 58 mL/min — ABNORMAL LOW (ref >60)
Glucose, Bld: 118 mg/dL — ABNORMAL HIGH (ref 65–99)
Potassium: 3.4 mmol/L — ABNORMAL LOW (ref 3.5–5.1)
Sodium: 135 mmol/L (ref 135–145)

## 2015-05-24 LAB — URINE CULTURE: Culture: >=100000

## 2015-05-24 LAB — GLUCOSE, CAPILLARY: Glucose-Capillary: 117 mg/dL — ABNORMAL HIGH (ref 65–99)

## 2015-05-24 MED ORDER — CEFPODOXIME PROXETIL 100 MG PO TABS: 100.0000 mg | ORAL_TABLET | Freq: Two times a day (BID) | ORAL | Status: DC

## 2015-05-24 NOTE — Progress Notes (Signed)
Patient discharged to home with family, IV and Telemetry dc'd. Vitals stable for patient. Discharged instructions and education reviewed with family and all questions addressed. 05/24/2015 11:51 AM Tonnya Garbett

## 2015-05-25 NOTE — Discharge Summary (Signed)
Physician Discharge Summary  Wayne Phillips ZOX:[id number] DOB: 11/24/1940 DOA: 05/21/2015  PCP: No primary care provider on file.  Admit date: 05/21/2015 Discharge date: 05/25/2015  Time spent: 45 minutes  Recommendations for Outpatient Follow-up:  1. PCP in 1 week   Discharge Diagnoses:  Principal Problem:   Sepsis   Ecoli UTI   CAD, h/o MI   Recent Arthroscopic Knee surgery   CKD 2  Discharge Condition: stable  Diet recommendation: heart healthy  Filed Weights   05/21/15 1706 05/21/15 2225  Weight: 113.399 kg (250 lb) 111.993 kg (246 lb 14.4 oz)    History of present illness:  Chief Complaint: AMS HPI: Wayne Phillips is a 74 y.o. male h/o MI in 2014, just had meniscal tear repaired in his left knee last week at surgery center with Dr. Luiz Blare. Prior to 8/24 had actually been doing better than expected / doing more than he was supposed to even. Had fully returned to work despite recency of surgery and age. on 8/24  at lunch, developed confusion, shivering. Brought in to ED by EMS. Found to have T of 103  Hospital Course:  1. Sepsis -was started on broad spectrum IV Abx on admission -initially source was not clear and ended up having L knee arthrocentesis by orthopedics, culture was negative for growth, gram stain etc -Blood Cx NGTD -subsequently urine culture when re-incubated grew >100K colonies of Ecoli -changed to IV ceftraixone and then to PO Cefpodoxime at discharge for 5 days -clinically improved and stable at the time of discharge  2. DM -stable, SSI used inpatient  3. CKD 3 -stable  4. H/o CAD -stable -continue plavix, Toprol  Procedures:  Arthrocentesis L knee  Consultations:  Ortho Dr.Graves  Discharge Exam: Filed Vitals:   05/24/15 0628  BP: 147/64  Pulse: 78  Temp: 98.7 F (37.1 C)  Resp: 20    General: AAOx3 Cardiovascular: S1S2/RRR Respiratory: CTAB  Discharge Instructions   Discharge Instructions    Diet - low sodium heart  healthy    Complete by:  As directed      Increase activity slowly    Complete by:  As directed           Discharge Medication List as of 05/24/2015 11:28 AM    START taking these medications   Details  cefpodoxime (VANTIN) 100 MG tablet Take 1 tablet (100 mg total) by mouth 2 (two) times daily. For 5 days, Starting 05/24/2015, Until Discontinued, Print      CONTINUE these medications which have NOT CHANGED   Details  acetaminophen (TYLENOL) 500 MG tablet Take 1,000 mg by mouth every 6 (six) hours as needed for moderate pain., Until Discontinued, Historical Med    clopidogrel (PLAVIX) 75 MG tablet Take 75 mg by mouth daily., Starting 04/14/2015, Until Discontinued, Historical Med    docusate sodium (COLACE) 100 MG capsule Take 100 mg by mouth at bedtime., Until Discontinued, Historical Med    metoprolol succinate (TOPROL-XL) 25 MG 24 hr tablet Take 25 mg by mouth daily., Starting 04/07/2015, Until Discontinued, Historical Med    oxyCODONE-acetaminophen (PERCOCET/ROXICET) 5-325 MG per tablet Take 1 tablet by mouth every 6 (six) hours as needed., Starting 05/16/2015, Until Discontinued, Historical Med    traMADol (ULTRAM) 50 MG tablet Take 50 mg by mouth daily as needed., Starting 05/06/2015, Until Discontinued, Historical Med    zolpidem (AMBIEN) 10 MG tablet Take 10 mg by mouth at bedtime as needed for sleep. , Starting 05/06/2015, Until Discontinued, Historical Med  Allergies  Allergen Reactions  . Novocain [Procaine]     Unknown per daughter, hasnt had since he was a child, is listed on allergy list on other chart that is being merged into this one.  . Statins     Myalgias with crestor and zocor and pravachol   Follow-up Information    Follow up with Lillia Mountain, MD. Schedule an appointment as soon as possible for a visit in 1 week.   Specialty:  Internal Medicine   Contact information:   [address] [place] [address] [phone]        Follow up with Urologist In 1 month.   Why:  to evaluate prostate       The results of significant diagnostics from this hospitalization (including imaging, microbiology, ancillary and laboratory) are listed below for reference.    Significant Diagnostic Studies: Dg Chest 2 View  05/21/2015   CLINICAL DATA:  Altered mental status, tremors, falling asleep, not responding.  EXAM: CHEST  2 VIEW  COMPARISON:  None.  FINDINGS: Trachea is midline. Heart size is accentuated by low lung volumes and AP semi upright technique. Lungs are grossly clear. No pleural fluid. Old left rib fractures.  IMPRESSION: Low lung volumes.  No acute findings.   Electronically Signed   By: Leanna Battles M.D.   On: 05/21/2015 18:04   Ct Head Wo Contrast  05/21/2015   CLINICAL DATA:  Altered mental status. Confusion. Decreased level of responsiveness. Possible loss of consciousness but no fall or trauma.  EXAM: CT HEAD WITHOUT CONTRAST  TECHNIQUE: Contiguous axial images were obtained from the base of the skull through the vertex without intravenous contrast.  COMPARISON:  None.  FINDINGS: Mild diffuse cerebral atrophy. Patchy low-attenuation changes in the deep white matter consistent with mild small vessel ischemic change. Mild ventricular dilatation consistent with central atrophy. No mass effect or midline shift. No abnormal extra-axial fluid collections. Gray-white matter junctions are distinct. Basal cisterns are not effaced. No evidence of acute intracranial hemorrhage. No depressed skull fractures. Retention cyst and mucosal thickening in the left maxillary antrum. Mastoid air cells are not opacified.  IMPRESSION: No acute intracranial abnormalities. Mild chronic atrophy and small vessel ischemic change.   Electronically Signed   By: Burman Nieves M.D.   On: 05/21/2015 18:22    Microbiology: Recent Results (from the past 240 hour(s))  Urine culture     Status: None   Collection Time: 05/21/15  5:35 PM  Result  Value Ref Range Status   Specimen Description URINE, CLEAN CATCH  Final   Special Requests NONE  Final   Culture >=100,000 COLONIES/mL ESCHERICHIA COLI  Final   Report Status 05/24/2015 FINAL  Final   Organism ID, Bacteria ESCHERICHIA COLI  Final      Susceptibility   Escherichia coli - MIC*    AMPICILLIN >=32 RESISTANT Resistant     CEFAZOLIN <=4 SENSITIVE Sensitive     CEFTRIAXONE <=1 SENSITIVE Sensitive     CIPROFLOXACIN >=4 RESISTANT Resistant     GENTAMICIN >=16 RESISTANT Resistant     IMIPENEM <=0.25 SENSITIVE Sensitive     NITROFURANTOIN <=16 SENSITIVE Sensitive     TRIMETH/SULFA <=20 SENSITIVE Sensitive     AMPICILLIN/SULBACTAM >=32 RESISTANT Resistant     PIP/TAZO <=4 SENSITIVE Sensitive     * >=100,000 COLONIES/mL ESCHERICHIA COLI  Culture, blood (routine x 2)     Status: None (Preliminary result)   Collection Time: 05/21/15  6:50 PM  Result Value  Ref Range Status   Specimen Description BLOOD RIGHT HAND  Final   Special Requests BOTTLES DRAWN AEROBIC AND ANAEROBIC 5CC  Final   Culture NO GROWTH 3 DAYS  Final   Report Status PENDING  Incomplete  Culture, blood (routine x 2)     Status: None (Preliminary result)   Collection Time: 05/21/15  6:55 PM  Result Value Ref Range Status   Specimen Description BLOOD LEFT HAND  Final   Special Requests BOTTLES DRAWN AEROBIC ONLY 5CC  Final   Culture NO GROWTH 3 DAYS  Final   Report Status PENDING  Incomplete  Culture, body fluid-bottle     Status: None (Preliminary result)   Collection Time: 05/22/15  3:17 PM  Result Value Ref Range Status   Specimen Description SYNOVIAL LEFT KNEE  Final   Special Requests NONE  Final   Culture NO GROWTH 2 DAYS  Final   Report Status PENDING  Incomplete  Gram stain     Status: None   Collection Time: 05/22/15  3:17 PM  Result Value Ref Range Status   Specimen Description SYNOVIAL LEFT KNEE  Final   Special Requests NONE  Final   Gram Stain   Final    MODERATE WBC PRESENT,BOTH PMN AND  MONONUCLEAR NO ORGANISMS SEEN    Report Status 05/22/2015 FINAL  Final     Labs: Basic Metabolic Panel:  Recent Labs Lab 05/21/15 1845 05/22/15 0555 05/23/15 1055 05/24/15 0528  NA 135 135 134* 135  K 4.4 4.2 3.8 3.4*  CL 102 106 105 102  CO2 22 19* 19* 22  GLUCOSE 138* 113* 142* 118*  BUN 18 14 14 13   CREATININE 1.43* 1.17 1.16 1.20  CALCIUM 9.2 8.1* 8.3* 8.2*   Liver Function Tests:  Recent Labs Lab 05/21/15 1845  AST 24  ALT 13*  ALKPHOS 62  BILITOT 2.0*  PROT 6.8  ALBUMIN 3.6   No results for input(s): LIPASE, AMYLASE in the last 168 hours.  Recent Labs Lab 05/21/15 1845  AMMONIA 20   CBC:  Recent Labs Lab 05/21/15 1845 05/22/15 0555 05/23/15 1055 05/24/15 0528  WBC 12.6* 12.0* 8.3 5.8  NEUTROABS 10.0*  --   --   --   HGB 13.7 12.1* 12.7* 11.8*  HCT 39.8 36.0* 36.6* 34.7*  MCV 90.2 91.6 90.6 89.0  PLT 208 174 190 218   Cardiac Enzymes: No results for input(s): CKTOTAL, CKMB, CKMBINDEX, TROPONINI in the last 168 hours. BNP: BNP (last 3 results) No results for input(s): BNP in the last 8760 hours.  ProBNP (last 3 results) No results for input(s): PROBNP in the last 8760 hours.  CBG:  Recent Labs Lab 05/22/15 2213 05/23/15 0756 05/23/15 1154 05/23/15 1659 05/24/15 0825  GLUCAP 131* 106* 159* 152* 117*       Signed:  Arturo Sofranko  [facility] 05/25/2015, 2:43 PM

## 2015-05-26 ENCOUNTER — Encounter: Payer: Self-pay | Admitting: *Deleted

## 2015-05-26 DIAGNOSIS — M25462 Effusion, left knee: Secondary | ICD-10-CM | POA: Diagnosis not present

## 2015-05-26 LAB — CULTURE, BLOOD (ROUTINE X 2)
CULTURE: NO GROWTH
CULTURE: NO GROWTH

## 2015-05-27 LAB — CULTURE, BODY FLUID-BOTTLE

## 2015-05-27 LAB — CULTURE, BODY FLUID W GRAM STAIN -BOTTLE: Culture: NO GROWTH

## 2015-06-03 DIAGNOSIS — F5104 Psychophysiologic insomnia: Secondary | ICD-10-CM | POA: Diagnosis not present

## 2015-06-03 DIAGNOSIS — Z23 Encounter for immunization: Secondary | ICD-10-CM | POA: Diagnosis not present

## 2015-06-03 DIAGNOSIS — N390 Urinary tract infection, site not specified: Secondary | ICD-10-CM | POA: Diagnosis not present

## 2015-06-03 DIAGNOSIS — N400 Enlarged prostate without lower urinary tract symptoms: Secondary | ICD-10-CM | POA: Diagnosis not present

## 2015-06-05 DIAGNOSIS — R2689 Other abnormalities of gait and mobility: Secondary | ICD-10-CM | POA: Diagnosis not present

## 2015-06-05 DIAGNOSIS — M25662 Stiffness of left knee, not elsewhere classified: Secondary | ICD-10-CM | POA: Diagnosis not present

## 2015-06-05 DIAGNOSIS — M25562 Pain in left knee: Secondary | ICD-10-CM | POA: Diagnosis not present

## 2015-06-10 DIAGNOSIS — M25662 Stiffness of left knee, not elsewhere classified: Secondary | ICD-10-CM | POA: Diagnosis not present

## 2015-06-10 DIAGNOSIS — R2689 Other abnormalities of gait and mobility: Secondary | ICD-10-CM | POA: Diagnosis not present

## 2015-06-10 DIAGNOSIS — M25562 Pain in left knee: Secondary | ICD-10-CM | POA: Diagnosis not present

## 2015-06-12 DIAGNOSIS — R2689 Other abnormalities of gait and mobility: Secondary | ICD-10-CM | POA: Diagnosis not present

## 2015-06-12 DIAGNOSIS — M25662 Stiffness of left knee, not elsewhere classified: Secondary | ICD-10-CM | POA: Diagnosis not present

## 2015-06-12 DIAGNOSIS — M25562 Pain in left knee: Secondary | ICD-10-CM | POA: Diagnosis not present

## 2015-06-17 DIAGNOSIS — M25662 Stiffness of left knee, not elsewhere classified: Secondary | ICD-10-CM | POA: Diagnosis not present

## 2015-06-17 DIAGNOSIS — R2689 Other abnormalities of gait and mobility: Secondary | ICD-10-CM | POA: Diagnosis not present

## 2015-06-17 DIAGNOSIS — M25562 Pain in left knee: Secondary | ICD-10-CM | POA: Diagnosis not present

## 2015-06-17 DIAGNOSIS — M25531 Pain in right wrist: Secondary | ICD-10-CM | POA: Diagnosis not present

## 2015-06-17 DIAGNOSIS — M25462 Effusion, left knee: Secondary | ICD-10-CM | POA: Diagnosis not present

## 2015-06-19 DIAGNOSIS — M25562 Pain in left knee: Secondary | ICD-10-CM | POA: Diagnosis not present

## 2015-06-19 DIAGNOSIS — M25662 Stiffness of left knee, not elsewhere classified: Secondary | ICD-10-CM | POA: Diagnosis not present

## 2015-06-19 DIAGNOSIS — R2689 Other abnormalities of gait and mobility: Secondary | ICD-10-CM | POA: Diagnosis not present

## 2015-06-24 DIAGNOSIS — R2689 Other abnormalities of gait and mobility: Secondary | ICD-10-CM | POA: Diagnosis not present

## 2015-06-24 DIAGNOSIS — M25662 Stiffness of left knee, not elsewhere classified: Secondary | ICD-10-CM | POA: Diagnosis not present

## 2015-06-24 DIAGNOSIS — M25562 Pain in left knee: Secondary | ICD-10-CM | POA: Diagnosis not present

## 2015-06-26 DIAGNOSIS — M25662 Stiffness of left knee, not elsewhere classified: Secondary | ICD-10-CM | POA: Diagnosis not present

## 2015-06-26 DIAGNOSIS — R2689 Other abnormalities of gait and mobility: Secondary | ICD-10-CM | POA: Diagnosis not present

## 2015-06-26 DIAGNOSIS — M25562 Pain in left knee: Secondary | ICD-10-CM | POA: Diagnosis not present

## 2015-07-01 DIAGNOSIS — M25562 Pain in left knee: Secondary | ICD-10-CM | POA: Diagnosis not present

## 2015-07-01 DIAGNOSIS — R2689 Other abnormalities of gait and mobility: Secondary | ICD-10-CM | POA: Diagnosis not present

## 2015-07-01 DIAGNOSIS — M25662 Stiffness of left knee, not elsewhere classified: Secondary | ICD-10-CM | POA: Diagnosis not present

## 2015-07-15 DIAGNOSIS — M25662 Stiffness of left knee, not elsewhere classified: Secondary | ICD-10-CM | POA: Diagnosis not present

## 2015-07-15 DIAGNOSIS — M25562 Pain in left knee: Secondary | ICD-10-CM | POA: Diagnosis not present

## 2015-07-15 DIAGNOSIS — R2689 Other abnormalities of gait and mobility: Secondary | ICD-10-CM | POA: Diagnosis not present

## 2015-07-22 DIAGNOSIS — M25562 Pain in left knee: Secondary | ICD-10-CM | POA: Diagnosis not present

## 2015-07-22 DIAGNOSIS — R2689 Other abnormalities of gait and mobility: Secondary | ICD-10-CM | POA: Diagnosis not present

## 2015-07-22 DIAGNOSIS — M25662 Stiffness of left knee, not elsewhere classified: Secondary | ICD-10-CM | POA: Diagnosis not present

## 2015-07-29 DIAGNOSIS — M25562 Pain in left knee: Secondary | ICD-10-CM | POA: Diagnosis not present

## 2015-07-29 DIAGNOSIS — M25662 Stiffness of left knee, not elsewhere classified: Secondary | ICD-10-CM | POA: Diagnosis not present

## 2015-07-29 DIAGNOSIS — R2689 Other abnormalities of gait and mobility: Secondary | ICD-10-CM | POA: Diagnosis not present

## 2015-08-04 DIAGNOSIS — E119 Type 2 diabetes mellitus without complications: Secondary | ICD-10-CM | POA: Diagnosis not present

## 2015-08-04 DIAGNOSIS — N183 Chronic kidney disease, stage 3 (moderate): Secondary | ICD-10-CM | POA: Diagnosis not present

## 2015-08-04 DIAGNOSIS — M25531 Pain in right wrist: Secondary | ICD-10-CM | POA: Diagnosis not present

## 2015-08-04 DIAGNOSIS — N401 Enlarged prostate with lower urinary tract symptoms: Secondary | ICD-10-CM | POA: Diagnosis not present

## 2015-08-19 DIAGNOSIS — M25562 Pain in left knee: Secondary | ICD-10-CM | POA: Diagnosis not present

## 2015-08-19 DIAGNOSIS — M25531 Pain in right wrist: Secondary | ICD-10-CM | POA: Diagnosis not present

## 2016-02-04 DIAGNOSIS — Z7984 Long term (current) use of oral hypoglycemic drugs: Secondary | ICD-10-CM | POA: Diagnosis not present

## 2016-02-04 DIAGNOSIS — Z0000 Encounter for general adult medical examination without abnormal findings: Secondary | ICD-10-CM | POA: Diagnosis not present

## 2016-02-04 DIAGNOSIS — N183 Chronic kidney disease, stage 3 (moderate): Secondary | ICD-10-CM | POA: Diagnosis not present

## 2016-02-04 DIAGNOSIS — Z1389 Encounter for screening for other disorder: Secondary | ICD-10-CM | POA: Diagnosis not present

## 2016-02-04 DIAGNOSIS — F5104 Psychophysiologic insomnia: Secondary | ICD-10-CM | POA: Diagnosis not present

## 2016-02-04 DIAGNOSIS — M179 Osteoarthritis of knee, unspecified: Secondary | ICD-10-CM | POA: Diagnosis not present

## 2016-02-04 DIAGNOSIS — M4696 Unspecified inflammatory spondylopathy, lumbar region: Secondary | ICD-10-CM | POA: Diagnosis not present

## 2016-02-04 DIAGNOSIS — Z6834 Body mass index (BMI) 34.0-34.9, adult: Secondary | ICD-10-CM | POA: Diagnosis not present

## 2016-02-04 DIAGNOSIS — I252 Old myocardial infarction: Secondary | ICD-10-CM | POA: Diagnosis not present

## 2016-02-04 DIAGNOSIS — E119 Type 2 diabetes mellitus without complications: Secondary | ICD-10-CM | POA: Diagnosis not present

## 2016-05-17 DIAGNOSIS — E782 Mixed hyperlipidemia: Secondary | ICD-10-CM | POA: Diagnosis not present

## 2016-05-17 DIAGNOSIS — I10 Essential (primary) hypertension: Secondary | ICD-10-CM | POA: Diagnosis not present

## 2016-05-17 DIAGNOSIS — I2510 Atherosclerotic heart disease of native coronary artery without angina pectoris: Secondary | ICD-10-CM | POA: Diagnosis not present

## 2016-06-28 DIAGNOSIS — I2510 Atherosclerotic heart disease of native coronary artery without angina pectoris: Secondary | ICD-10-CM | POA: Diagnosis not present

## 2016-06-28 DIAGNOSIS — N183 Chronic kidney disease, stage 3 (moderate): Secondary | ICD-10-CM | POA: Diagnosis not present

## 2016-06-28 DIAGNOSIS — I129 Hypertensive chronic kidney disease with stage 1 through stage 4 chronic kidney disease, or unspecified chronic kidney disease: Secondary | ICD-10-CM | POA: Diagnosis not present

## 2016-06-28 DIAGNOSIS — E782 Mixed hyperlipidemia: Secondary | ICD-10-CM | POA: Diagnosis not present

## 2016-06-28 DIAGNOSIS — Z87891 Personal history of nicotine dependence: Secondary | ICD-10-CM | POA: Diagnosis not present

## 2016-06-28 DIAGNOSIS — I10 Essential (primary) hypertension: Secondary | ICD-10-CM | POA: Diagnosis not present

## 2016-06-30 DIAGNOSIS — I2510 Atherosclerotic heart disease of native coronary artery without angina pectoris: Secondary | ICD-10-CM | POA: Diagnosis not present

## 2016-06-30 DIAGNOSIS — I517 Cardiomegaly: Secondary | ICD-10-CM | POA: Diagnosis not present

## 2016-08-06 DIAGNOSIS — N401 Enlarged prostate with lower urinary tract symptoms: Secondary | ICD-10-CM | POA: Diagnosis not present

## 2016-08-06 DIAGNOSIS — E119 Type 2 diabetes mellitus without complications: Secondary | ICD-10-CM | POA: Diagnosis not present

## 2016-08-06 DIAGNOSIS — Z23 Encounter for immunization: Secondary | ICD-10-CM | POA: Diagnosis not present

## 2016-08-06 DIAGNOSIS — N183 Chronic kidney disease, stage 3 (moderate): Secondary | ICD-10-CM | POA: Diagnosis not present

## 2016-08-06 DIAGNOSIS — M179 Osteoarthritis of knee, unspecified: Secondary | ICD-10-CM | POA: Diagnosis not present

## 2016-11-09 DIAGNOSIS — H6983 Other specified disorders of Eustachian tube, bilateral: Secondary | ICD-10-CM | POA: Diagnosis not present

## 2016-11-09 DIAGNOSIS — H93233 Hyperacusis, bilateral: Secondary | ICD-10-CM | POA: Diagnosis not present

## 2016-11-09 DIAGNOSIS — H903 Sensorineural hearing loss, bilateral: Secondary | ICD-10-CM | POA: Diagnosis not present

## 2016-12-02 ENCOUNTER — Ambulatory Visit (INDEPENDENT_AMBULATORY_CARE_PROVIDER_SITE_OTHER): Payer: Medicare PPO | Admitting: Physician Assistant

## 2016-12-02 ENCOUNTER — Encounter: Payer: Self-pay | Admitting: Physician Assistant

## 2016-12-02 VITALS — BP 123/75 | HR 63 | Temp 97.8°F | Resp 16

## 2016-12-02 DIAGNOSIS — S8990XA Unspecified injury of unspecified lower leg, initial encounter: Secondary | ICD-10-CM

## 2016-12-02 NOTE — Patient Instructions (Signed)
     IF you received an x-ray today, you will receive an invoice from [facility]. Please contact [facility] at [phone] with questions or concerns regarding your invoice.   IF you received labwork today, you will receive an invoice from [facility]. Please contact [facility] at [phone] with questions or concerns regarding your invoice.   Our billing staff will not be able to assist you with questions regarding bills from these companies.  You will be contacted with the lab results as soon as they are available. The fastest way to get your results is to activate your My Chart account. Instructions are located on the last page of this paperwork. If you have not heard from us regarding the results in 2 weeks, please contact this office.     

## 2016-12-02 NOTE — Progress Notes (Signed)
Pt left after triage without being seen.

## 2016-12-07 DIAGNOSIS — S8011XA Contusion of right lower leg, initial encounter: Secondary | ICD-10-CM | POA: Diagnosis not present

## 2016-12-14 DIAGNOSIS — S8011XA Contusion of right lower leg, initial encounter: Secondary | ICD-10-CM | POA: Diagnosis not present

## 2016-12-14 DIAGNOSIS — T148XXA Other injury of unspecified body region, initial encounter: Secondary | ICD-10-CM | POA: Diagnosis not present

## 2016-12-27 DIAGNOSIS — E782 Mixed hyperlipidemia: Secondary | ICD-10-CM | POA: Diagnosis not present

## 2016-12-27 DIAGNOSIS — Z789 Other specified health status: Secondary | ICD-10-CM | POA: Diagnosis not present

## 2016-12-27 DIAGNOSIS — I359 Nonrheumatic aortic valve disorder, unspecified: Secondary | ICD-10-CM | POA: Diagnosis not present

## 2016-12-27 DIAGNOSIS — N183 Chronic kidney disease, stage 3 (moderate): Secondary | ICD-10-CM | POA: Diagnosis not present

## 2016-12-27 DIAGNOSIS — I10 Essential (primary) hypertension: Secondary | ICD-10-CM | POA: Diagnosis not present

## 2016-12-27 DIAGNOSIS — I2510 Atherosclerotic heart disease of native coronary artery without angina pectoris: Secondary | ICD-10-CM | POA: Diagnosis not present

## 2016-12-28 DIAGNOSIS — I2510 Atherosclerotic heart disease of native coronary artery without angina pectoris: Secondary | ICD-10-CM | POA: Diagnosis not present

## 2016-12-28 DIAGNOSIS — I359 Nonrheumatic aortic valve disorder, unspecified: Secondary | ICD-10-CM | POA: Diagnosis not present

## 2016-12-28 DIAGNOSIS — Z789 Other specified health status: Secondary | ICD-10-CM | POA: Diagnosis not present

## 2016-12-28 DIAGNOSIS — I129 Hypertensive chronic kidney disease with stage 1 through stage 4 chronic kidney disease, or unspecified chronic kidney disease: Secondary | ICD-10-CM | POA: Diagnosis not present

## 2016-12-28 DIAGNOSIS — E782 Mixed hyperlipidemia: Secondary | ICD-10-CM | POA: Diagnosis not present

## 2016-12-28 DIAGNOSIS — N183 Chronic kidney disease, stage 3 (moderate): Secondary | ICD-10-CM | POA: Diagnosis not present

## 2017-02-04 DIAGNOSIS — Z1389 Encounter for screening for other disorder: Secondary | ICD-10-CM | POA: Diagnosis not present

## 2017-02-04 DIAGNOSIS — Z0000 Encounter for general adult medical examination without abnormal findings: Secondary | ICD-10-CM | POA: Diagnosis not present

## 2017-02-04 DIAGNOSIS — I129 Hypertensive chronic kidney disease with stage 1 through stage 4 chronic kidney disease, or unspecified chronic kidney disease: Secondary | ICD-10-CM | POA: Diagnosis not present

## 2017-02-04 DIAGNOSIS — N183 Chronic kidney disease, stage 3 (moderate): Secondary | ICD-10-CM | POA: Diagnosis not present

## 2017-02-04 DIAGNOSIS — E1122 Type 2 diabetes mellitus with diabetic chronic kidney disease: Secondary | ICD-10-CM | POA: Diagnosis not present

## 2017-02-04 DIAGNOSIS — E1151 Type 2 diabetes mellitus with diabetic peripheral angiopathy without gangrene: Secondary | ICD-10-CM | POA: Diagnosis not present

## 2017-02-04 DIAGNOSIS — F5104 Psychophysiologic insomnia: Secondary | ICD-10-CM | POA: Diagnosis not present

## 2017-02-04 DIAGNOSIS — M4696 Unspecified inflammatory spondylopathy, lumbar region: Secondary | ICD-10-CM | POA: Diagnosis not present

## 2017-06-09 DIAGNOSIS — J984 Other disorders of lung: Secondary | ICD-10-CM | POA: Diagnosis not present

## 2017-06-09 DIAGNOSIS — N3000 Acute cystitis without hematuria: Secondary | ICD-10-CM | POA: Diagnosis not present

## 2017-06-09 DIAGNOSIS — E119 Type 2 diabetes mellitus without complications: Secondary | ICD-10-CM | POA: Diagnosis not present

## 2017-06-09 DIAGNOSIS — B9620 Unspecified Escherichia coli [E. coli] as the cause of diseases classified elsewhere: Secondary | ICD-10-CM | POA: Diagnosis not present

## 2017-06-09 DIAGNOSIS — Z79899 Other long term (current) drug therapy: Secondary | ICD-10-CM | POA: Diagnosis not present

## 2017-06-09 DIAGNOSIS — I10 Essential (primary) hypertension: Secondary | ICD-10-CM | POA: Diagnosis not present

## 2017-06-09 DIAGNOSIS — N170 Acute kidney failure with tubular necrosis: Secondary | ICD-10-CM | POA: Diagnosis not present

## 2017-06-09 DIAGNOSIS — N179 Acute kidney failure, unspecified: Secondary | ICD-10-CM | POA: Diagnosis not present

## 2017-06-09 DIAGNOSIS — Z7982 Long term (current) use of aspirin: Secondary | ICD-10-CM | POA: Diagnosis not present

## 2017-06-09 DIAGNOSIS — N289 Disorder of kidney and ureter, unspecified: Secondary | ICD-10-CM | POA: Diagnosis not present

## 2017-06-09 DIAGNOSIS — N3001 Acute cystitis with hematuria: Secondary | ICD-10-CM | POA: Diagnosis not present

## 2017-06-09 DIAGNOSIS — F329 Major depressive disorder, single episode, unspecified: Secondary | ICD-10-CM | POA: Diagnosis not present

## 2017-06-09 DIAGNOSIS — E860 Dehydration: Secondary | ICD-10-CM | POA: Diagnosis not present

## 2017-06-09 DIAGNOSIS — T370X5A Adverse effect of sulfonamides, initial encounter: Secondary | ICD-10-CM | POA: Diagnosis not present

## 2017-06-17 DIAGNOSIS — R350 Frequency of micturition: Secondary | ICD-10-CM | POA: Diagnosis not present

## 2017-06-20 DIAGNOSIS — R350 Frequency of micturition: Secondary | ICD-10-CM | POA: Diagnosis not present

## 2017-06-28 DIAGNOSIS — I359 Nonrheumatic aortic valve disorder, unspecified: Secondary | ICD-10-CM | POA: Diagnosis not present

## 2017-06-28 DIAGNOSIS — N183 Chronic kidney disease, stage 3 (moderate): Secondary | ICD-10-CM | POA: Diagnosis not present

## 2017-06-28 DIAGNOSIS — I10 Essential (primary) hypertension: Secondary | ICD-10-CM | POA: Diagnosis not present

## 2017-06-28 DIAGNOSIS — E782 Mixed hyperlipidemia: Secondary | ICD-10-CM | POA: Diagnosis not present

## 2017-06-28 DIAGNOSIS — I2510 Atherosclerotic heart disease of native coronary artery without angina pectoris: Secondary | ICD-10-CM | POA: Diagnosis not present

## 2017-06-28 DIAGNOSIS — R0602 Shortness of breath: Secondary | ICD-10-CM | POA: Diagnosis not present

## 2017-07-05 DIAGNOSIS — R0602 Shortness of breath: Secondary | ICD-10-CM | POA: Diagnosis not present

## 2017-07-05 DIAGNOSIS — I2510 Atherosclerotic heart disease of native coronary artery without angina pectoris: Secondary | ICD-10-CM | POA: Diagnosis not present

## 2017-07-05 DIAGNOSIS — N390 Urinary tract infection, site not specified: Secondary | ICD-10-CM | POA: Diagnosis not present

## 2017-07-05 DIAGNOSIS — N183 Chronic kidney disease, stage 3 (moderate): Secondary | ICD-10-CM | POA: Diagnosis not present

## 2017-08-08 DIAGNOSIS — N401 Enlarged prostate with lower urinary tract symptoms: Secondary | ICD-10-CM | POA: Diagnosis not present

## 2017-08-08 DIAGNOSIS — E781 Pure hyperglyceridemia: Secondary | ICD-10-CM | POA: Diagnosis not present

## 2017-08-08 DIAGNOSIS — F5104 Psychophysiologic insomnia: Secondary | ICD-10-CM | POA: Diagnosis not present

## 2017-08-08 DIAGNOSIS — F325 Major depressive disorder, single episode, in full remission: Secondary | ICD-10-CM | POA: Diagnosis not present

## 2017-08-08 DIAGNOSIS — I739 Peripheral vascular disease, unspecified: Secondary | ICD-10-CM | POA: Diagnosis not present

## 2017-08-08 DIAGNOSIS — E1122 Type 2 diabetes mellitus with diabetic chronic kidney disease: Secondary | ICD-10-CM | POA: Diagnosis not present

## 2017-08-08 DIAGNOSIS — N183 Chronic kidney disease, stage 3 (moderate): Secondary | ICD-10-CM | POA: Diagnosis not present

## 2017-08-08 DIAGNOSIS — E1151 Type 2 diabetes mellitus with diabetic peripheral angiopathy without gangrene: Secondary | ICD-10-CM | POA: Diagnosis not present

## 2017-08-08 DIAGNOSIS — Z23 Encounter for immunization: Secondary | ICD-10-CM | POA: Diagnosis not present

## 2017-08-09 DIAGNOSIS — I10 Essential (primary) hypertension: Secondary | ICD-10-CM | POA: Diagnosis not present

## 2017-08-09 DIAGNOSIS — I359 Nonrheumatic aortic valve disorder, unspecified: Secondary | ICD-10-CM | POA: Diagnosis not present

## 2017-08-09 DIAGNOSIS — R29818 Other symptoms and signs involving the nervous system: Secondary | ICD-10-CM | POA: Diagnosis not present

## 2017-08-09 DIAGNOSIS — I25118 Atherosclerotic heart disease of native coronary artery with other forms of angina pectoris: Secondary | ICD-10-CM | POA: Diagnosis not present

## 2017-08-09 DIAGNOSIS — M25562 Pain in left knee: Secondary | ICD-10-CM | POA: Diagnosis not present

## 2017-08-09 DIAGNOSIS — M23201 Derangement of unspecified lateral meniscus due to old tear or injury, left knee: Secondary | ICD-10-CM | POA: Diagnosis not present

## 2017-08-09 DIAGNOSIS — E782 Mixed hyperlipidemia: Secondary | ICD-10-CM | POA: Diagnosis not present

## 2017-08-09 DIAGNOSIS — I208 Other forms of angina pectoris: Secondary | ICD-10-CM | POA: Diagnosis not present

## 2017-08-09 DIAGNOSIS — G8929 Other chronic pain: Secondary | ICD-10-CM | POA: Diagnosis not present

## 2017-08-30 DIAGNOSIS — M25562 Pain in left knee: Secondary | ICD-10-CM | POA: Diagnosis not present

## 2017-08-30 DIAGNOSIS — M175 Other unilateral secondary osteoarthritis of knee: Secondary | ICD-10-CM | POA: Diagnosis not present

## 2017-08-30 DIAGNOSIS — G8929 Other chronic pain: Secondary | ICD-10-CM | POA: Diagnosis not present

## 2017-10-06 DIAGNOSIS — N401 Enlarged prostate with lower urinary tract symptoms: Secondary | ICD-10-CM | POA: Diagnosis not present

## 2017-10-20 DIAGNOSIS — H25819 Combined forms of age-related cataract, unspecified eye: Secondary | ICD-10-CM | POA: Diagnosis not present

## 2017-10-25 ENCOUNTER — Telehealth (HOSPITAL_COMMUNITY): Payer: Self-pay

## 2017-10-25 NOTE — Telephone Encounter (Signed)
Patients insurance is active and benefits verified through Medicare Part A & B - No co-pay, deductible amount of $185.00/$133.57 has been met, no out of pocket, 20% co-insurance, and no pre-authorization is required. Passport/reference [phone]  Patients insurance is active and benefits verified through [facility] - $15.00 co-pay, no deductible, out of pocket amount of $10,000/$15.00 has been met, no co-insurance, and no pre-authorization is required. Passport/reference [phone]  Patient will be contacted and scheduled.

## 2017-10-26 ENCOUNTER — Telehealth (HOSPITAL_COMMUNITY): Payer: Self-pay

## 2017-10-26 NOTE — Telephone Encounter (Signed)
Attempted to call patient in regards to Cardiac Rehab - lm on vm °

## 2017-10-31 ENCOUNTER — Telehealth (HOSPITAL_COMMUNITY): Payer: Self-pay

## 2017-10-31 NOTE — Telephone Encounter (Signed)
Patient returned phone call in regards to Cardiac Rehab - Patient is interested in the program. Scheduled orientation on 12/15/2017 at 8:00am. Patient will attend the 8:15am exc class.

## 2017-11-10 DIAGNOSIS — Z87891 Personal history of nicotine dependence: Secondary | ICD-10-CM | POA: Diagnosis not present

## 2017-11-10 DIAGNOSIS — E782 Mixed hyperlipidemia: Secondary | ICD-10-CM | POA: Diagnosis not present

## 2017-11-10 DIAGNOSIS — I358 Other nonrheumatic aortic valve disorders: Secondary | ICD-10-CM | POA: Diagnosis not present

## 2017-11-10 DIAGNOSIS — I10 Essential (primary) hypertension: Secondary | ICD-10-CM | POA: Diagnosis not present

## 2017-11-10 DIAGNOSIS — I25118 Atherosclerotic heart disease of native coronary artery with other forms of angina pectoris: Secondary | ICD-10-CM | POA: Diagnosis not present

## 2017-11-10 DIAGNOSIS — I359 Nonrheumatic aortic valve disorder, unspecified: Secondary | ICD-10-CM | POA: Diagnosis not present

## 2017-11-21 DIAGNOSIS — H2513 Age-related nuclear cataract, bilateral: Secondary | ICD-10-CM | POA: Diagnosis not present

## 2017-11-21 DIAGNOSIS — E119 Type 2 diabetes mellitus without complications: Secondary | ICD-10-CM | POA: Diagnosis not present

## 2017-11-21 DIAGNOSIS — H40013 Open angle with borderline findings, low risk, bilateral: Secondary | ICD-10-CM | POA: Diagnosis not present

## 2017-12-11 DIAGNOSIS — N3001 Acute cystitis with hematuria: Secondary | ICD-10-CM | POA: Diagnosis not present

## 2017-12-13 ENCOUNTER — Telehealth (HOSPITAL_COMMUNITY): Payer: Self-pay | Admitting: Pharmacy Technician

## 2017-12-13 DIAGNOSIS — R3915 Urgency of urination: Secondary | ICD-10-CM | POA: Diagnosis not present

## 2017-12-13 DIAGNOSIS — R300 Dysuria: Secondary | ICD-10-CM | POA: Diagnosis not present

## 2017-12-13 DIAGNOSIS — R32 Unspecified urinary incontinence: Secondary | ICD-10-CM | POA: Diagnosis not present

## 2017-12-14 ENCOUNTER — Telehealth (HOSPITAL_COMMUNITY): Payer: Self-pay

## 2017-12-14 NOTE — Telephone Encounter (Signed)
Patient called to cancel orientation and exc classes due to constipation and back pain. Patient stated he does not want to reschedule right now. Closed referral.

## 2017-12-15 ENCOUNTER — Ambulatory Visit (HOSPITAL_COMMUNITY): Payer: Self-pay

## 2017-12-15 DIAGNOSIS — N390 Urinary tract infection, site not specified: Secondary | ICD-10-CM | POA: Diagnosis not present

## 2017-12-16 DIAGNOSIS — N390 Urinary tract infection, site not specified: Secondary | ICD-10-CM | POA: Diagnosis not present

## 2017-12-16 DIAGNOSIS — R21 Rash and other nonspecific skin eruption: Secondary | ICD-10-CM | POA: Diagnosis not present

## 2017-12-19 ENCOUNTER — Ambulatory Visit (HOSPITAL_COMMUNITY): Payer: Self-pay

## 2017-12-21 ENCOUNTER — Ambulatory Visit (HOSPITAL_COMMUNITY): Payer: Self-pay

## 2017-12-23 ENCOUNTER — Ambulatory Visit (HOSPITAL_COMMUNITY): Payer: Self-pay

## 2017-12-26 ENCOUNTER — Ambulatory Visit (HOSPITAL_COMMUNITY): Payer: Self-pay

## 2017-12-28 ENCOUNTER — Ambulatory Visit (HOSPITAL_COMMUNITY): Payer: Self-pay

## 2017-12-30 ENCOUNTER — Ambulatory Visit (HOSPITAL_COMMUNITY): Payer: Self-pay

## 2018-01-02 ENCOUNTER — Ambulatory Visit (HOSPITAL_COMMUNITY): Payer: Self-pay

## 2018-01-04 ENCOUNTER — Ambulatory Visit (HOSPITAL_COMMUNITY): Payer: Self-pay

## 2018-01-05 DIAGNOSIS — R311 Benign essential microscopic hematuria: Secondary | ICD-10-CM | POA: Diagnosis not present

## 2018-01-05 DIAGNOSIS — N401 Enlarged prostate with lower urinary tract symptoms: Secondary | ICD-10-CM | POA: Diagnosis not present

## 2018-01-05 DIAGNOSIS — N3020 Other chronic cystitis without hematuria: Secondary | ICD-10-CM | POA: Diagnosis not present

## 2018-01-05 DIAGNOSIS — R351 Nocturia: Secondary | ICD-10-CM | POA: Diagnosis not present

## 2018-01-05 DIAGNOSIS — R310 Gross hematuria: Secondary | ICD-10-CM | POA: Diagnosis not present

## 2018-01-06 ENCOUNTER — Ambulatory Visit (HOSPITAL_COMMUNITY): Payer: Self-pay

## 2018-01-09 ENCOUNTER — Ambulatory Visit (HOSPITAL_COMMUNITY): Payer: Self-pay

## 2018-01-11 ENCOUNTER — Ambulatory Visit (HOSPITAL_COMMUNITY): Payer: Self-pay

## 2018-01-13 ENCOUNTER — Ambulatory Visit (HOSPITAL_COMMUNITY): Payer: Self-pay

## 2018-01-16 ENCOUNTER — Ambulatory Visit (HOSPITAL_COMMUNITY): Payer: Self-pay

## 2018-01-18 ENCOUNTER — Ambulatory Visit (HOSPITAL_COMMUNITY): Payer: Self-pay

## 2018-01-20 ENCOUNTER — Ambulatory Visit (HOSPITAL_COMMUNITY): Payer: Self-pay

## 2018-01-23 ENCOUNTER — Ambulatory Visit (HOSPITAL_COMMUNITY): Payer: Self-pay

## 2018-01-25 ENCOUNTER — Ambulatory Visit (HOSPITAL_COMMUNITY): Payer: Self-pay

## 2018-01-27 ENCOUNTER — Ambulatory Visit (HOSPITAL_COMMUNITY): Payer: Self-pay

## 2018-01-30 ENCOUNTER — Ambulatory Visit (HOSPITAL_COMMUNITY): Payer: Self-pay

## 2018-02-01 ENCOUNTER — Ambulatory Visit (HOSPITAL_COMMUNITY): Payer: Self-pay

## 2018-02-03 ENCOUNTER — Ambulatory Visit (HOSPITAL_COMMUNITY): Payer: Self-pay

## 2018-02-06 ENCOUNTER — Ambulatory Visit (HOSPITAL_COMMUNITY): Payer: Self-pay

## 2018-02-07 ENCOUNTER — Other Ambulatory Visit: Payer: Self-pay | Admitting: Urology

## 2018-02-07 DIAGNOSIS — R310 Gross hematuria: Secondary | ICD-10-CM

## 2018-02-08 ENCOUNTER — Ambulatory Visit (HOSPITAL_COMMUNITY): Payer: Self-pay

## 2018-02-10 ENCOUNTER — Ambulatory Visit (HOSPITAL_COMMUNITY): Payer: Self-pay

## 2018-02-13 ENCOUNTER — Ambulatory Visit (HOSPITAL_COMMUNITY)
Admission: RE | Admit: 2018-02-13 | Discharge: 2018-02-13 | Disposition: A | Discharge status: Home / Self Care | Payer: Medicare PPO | Source: Ambulatory Visit | Attending: Urology | Admitting: Urology

## 2018-02-13 ENCOUNTER — Ambulatory Visit (HOSPITAL_COMMUNITY): Payer: Self-pay

## 2018-02-13 ENCOUNTER — Encounter (HOSPITAL_COMMUNITY): Payer: Self-pay

## 2018-02-13 DIAGNOSIS — R9341 Abnormal radiologic findings on diagnostic imaging of renal pelvis, ureter, or bladder: Secondary | ICD-10-CM | POA: Insufficient documentation

## 2018-02-13 DIAGNOSIS — R310 Gross hematuria: Secondary | ICD-10-CM | POA: Diagnosis not present

## 2018-02-13 DIAGNOSIS — N281 Cyst of kidney, acquired: Secondary | ICD-10-CM | POA: Insufficient documentation

## 2018-02-13 DIAGNOSIS — R319 Hematuria, unspecified: Secondary | ICD-10-CM | POA: Diagnosis not present

## 2018-02-13 DIAGNOSIS — I700 Atherosclerosis of aorta: Secondary | ICD-10-CM | POA: Insufficient documentation

## 2018-02-13 HISTORY — DX: Essential (primary) hypertension: I10

## 2018-02-13 MED ORDER — IOPAMIDOL (ISOVUE-300) INJECTION 61%
INTRAVENOUS | Status: AC
  Administered 2018-02-13: 125 mL
  Filled 2018-02-13: qty 150

## 2018-02-13 MED ORDER — IOPAMIDOL (ISOVUE-300) INJECTION 61%
INTRAVENOUS | Status: AC
  Filled 2018-02-13: qty 150

## 2018-02-13 MED ORDER — SODIUM CHLORIDE 0.9 % IV SOLN
INTRAVENOUS | Status: AC
  Filled 2018-02-13: qty 250

## 2018-02-15 ENCOUNTER — Ambulatory Visit (HOSPITAL_COMMUNITY): Payer: Self-pay

## 2018-02-17 ENCOUNTER — Ambulatory Visit (HOSPITAL_COMMUNITY): Payer: Self-pay

## 2018-02-22 ENCOUNTER — Ambulatory Visit (HOSPITAL_COMMUNITY): Payer: Self-pay

## 2018-02-22 DIAGNOSIS — N3020 Other chronic cystitis without hematuria: Secondary | ICD-10-CM | POA: Diagnosis not present

## 2018-02-22 DIAGNOSIS — N401 Enlarged prostate with lower urinary tract symptoms: Secondary | ICD-10-CM | POA: Diagnosis not present

## 2018-02-22 DIAGNOSIS — R300 Dysuria: Secondary | ICD-10-CM | POA: Diagnosis not present

## 2018-02-22 DIAGNOSIS — R351 Nocturia: Secondary | ICD-10-CM | POA: Diagnosis not present

## 2018-02-24 ENCOUNTER — Ambulatory Visit (HOSPITAL_COMMUNITY): Payer: Self-pay

## 2018-02-27 ENCOUNTER — Ambulatory Visit (HOSPITAL_COMMUNITY): Payer: Self-pay

## 2018-03-01 ENCOUNTER — Ambulatory Visit (HOSPITAL_COMMUNITY): Payer: Self-pay

## 2018-03-03 ENCOUNTER — Ambulatory Visit (HOSPITAL_COMMUNITY): Payer: Self-pay

## 2018-03-06 ENCOUNTER — Ambulatory Visit (HOSPITAL_COMMUNITY): Payer: Self-pay

## 2018-03-08 ENCOUNTER — Ambulatory Visit (HOSPITAL_COMMUNITY): Payer: Self-pay

## 2018-03-10 ENCOUNTER — Ambulatory Visit (HOSPITAL_COMMUNITY): Payer: Self-pay

## 2018-03-13 ENCOUNTER — Ambulatory Visit (HOSPITAL_COMMUNITY): Payer: Self-pay

## 2018-03-15 ENCOUNTER — Ambulatory Visit (HOSPITAL_COMMUNITY): Payer: Self-pay

## 2018-03-17 ENCOUNTER — Ambulatory Visit (HOSPITAL_COMMUNITY): Payer: Self-pay

## 2018-03-20 ENCOUNTER — Ambulatory Visit (HOSPITAL_COMMUNITY): Payer: Self-pay

## 2018-03-22 ENCOUNTER — Ambulatory Visit (HOSPITAL_COMMUNITY): Payer: Self-pay

## 2018-03-29 DIAGNOSIS — N401 Enlarged prostate with lower urinary tract symptoms: Secondary | ICD-10-CM | POA: Diagnosis not present

## 2018-03-29 DIAGNOSIS — R351 Nocturia: Secondary | ICD-10-CM | POA: Diagnosis not present

## 2018-03-29 DIAGNOSIS — R310 Gross hematuria: Secondary | ICD-10-CM | POA: Diagnosis not present

## 2018-05-08 DIAGNOSIS — I10 Essential (primary) hypertension: Secondary | ICD-10-CM | POA: Diagnosis not present

## 2018-05-08 DIAGNOSIS — N135 Crossing vessel and stricture of ureter without hydronephrosis: Secondary | ICD-10-CM | POA: Diagnosis not present

## 2018-05-08 DIAGNOSIS — E782 Mixed hyperlipidemia: Secondary | ICD-10-CM | POA: Diagnosis not present

## 2018-05-08 DIAGNOSIS — I25118 Atherosclerotic heart disease of native coronary artery with other forms of angina pectoris: Secondary | ICD-10-CM | POA: Diagnosis not present

## 2018-05-08 DIAGNOSIS — N183 Chronic kidney disease, stage 3 (moderate): Secondary | ICD-10-CM | POA: Diagnosis not present

## 2018-05-08 DIAGNOSIS — R0602 Shortness of breath: Secondary | ICD-10-CM | POA: Diagnosis not present

## 2018-05-08 DIAGNOSIS — R942 Abnormal results of pulmonary function studies: Secondary | ICD-10-CM | POA: Diagnosis not present

## 2018-05-08 DIAGNOSIS — R3915 Urgency of urination: Secondary | ICD-10-CM | POA: Diagnosis not present

## 2018-05-08 DIAGNOSIS — I359 Nonrheumatic aortic valve disorder, unspecified: Secondary | ICD-10-CM | POA: Diagnosis not present

## 2018-05-17 ENCOUNTER — Emergency Department (HOSPITAL_COMMUNITY)
Admission: EM | Admit: 2018-05-17 | Discharge: 2018-05-17 | Disposition: A | Discharge status: Home / Self Care | Payer: Medicare PPO | Attending: Emergency Medicine | Admitting: Emergency Medicine

## 2018-05-17 ENCOUNTER — Encounter (HOSPITAL_COMMUNITY): Payer: Self-pay | Admitting: Emergency Medicine

## 2018-05-17 DIAGNOSIS — R7989 Other specified abnormal findings of blood chemistry: Secondary | ICD-10-CM | POA: Insufficient documentation

## 2018-05-17 DIAGNOSIS — I959 Hypotension, unspecified: Secondary | ICD-10-CM | POA: Diagnosis not present

## 2018-05-17 DIAGNOSIS — Z5321 Procedure and treatment not carried out due to patient leaving prior to being seen by health care provider: Secondary | ICD-10-CM | POA: Diagnosis not present

## 2018-05-17 DIAGNOSIS — R55 Syncope and collapse: Secondary | ICD-10-CM | POA: Diagnosis not present

## 2018-05-17 DIAGNOSIS — N390 Urinary tract infection, site not specified: Secondary | ICD-10-CM | POA: Diagnosis not present

## 2018-05-17 NOTE — ED Triage Notes (Addendum)
Pt reports that he was called by [facility] PCP today stating to go to ED for elevated lab work. Pt states was told "dyson level was high". Asked pt if it could have been d-dimer, pt stated no.

## 2018-05-17 NOTE — ED Notes (Signed)
Pt came out to station upset about having to wait stating, "my doctor sent me here emergently." Patient will not let this writer explain the process. Patient states, "I will leave in 20 minutes if I'm not seen." This writer stated understanding and that if he wanted to leave it was his right. Patient states, "What is wrong in your brain that you would think it was okay that I leave?" This writer attempted to explain that we do not know what is going on yet but patient would not let this Clinical research associatewriter finish again. Toniann FailWendy, AD at bedside to speak with patient.

## 2018-05-17 NOTE — ED Notes (Signed)
Pt came out to nurse's desk asking what is taking so long and if the triage room he is waiting in is the only room in the emergency department. Pt was explained that this was triage room, and we are waiting for an available treatment room so he can be evaluated by one of our emergency room providers. Pt states "am I in the emergency department?" ED staff at triage desk explained that patient was at ED. Pt then asked why he was not an emergency because his provider called him on the phone and told him to get to the emergency department immediately. Pt explained that all his vital signs were stable and we unfortunately dont have any available rooms at this time and we would get him to a room as soon as one is available.

## 2018-05-17 NOTE — ED Notes (Signed)
Patient came out to desk again, upset. Wife came out apologizing to ED staff and asking to speak with charge nurse. Made Administrator, artsJake Charge RN aware.

## 2018-05-17 NOTE — ED Notes (Addendum)
Pt in triage demanding to see a physician now and refusing to go out to the waiting room. This Clinical research associatewriter was called to speak to patient and his wife. Pt was so angry he wouldn't let this writer speak a complete sentence without cutting me off.  I offered to look at the track board to see how long a wait he would have and he refused, ripping off his arm band.  [employer] present for this writers safety as patient was very angry and verbally abusive.   Pt was told he would be next to be taken to a room and he chose to leave without being seen.  Pt said"  he had only seen a nurse and had not been triaged and that he was 77 years old and knows what triage is".  Pt refused to stay to be seen for elevated d-dyson ?? of 2.5 ( per patient).    Cathie BeamsSigned W. Munro, RN, AD

## 2018-05-19 DIAGNOSIS — R269 Unspecified abnormalities of gait and mobility: Secondary | ICD-10-CM | POA: Diagnosis not present

## 2018-05-19 DIAGNOSIS — R413 Other amnesia: Secondary | ICD-10-CM | POA: Diagnosis not present

## 2018-05-19 DIAGNOSIS — R3914 Feeling of incomplete bladder emptying: Secondary | ICD-10-CM | POA: Diagnosis not present

## 2018-05-19 DIAGNOSIS — N183 Chronic kidney disease, stage 3 (moderate): Secondary | ICD-10-CM | POA: Diagnosis not present

## 2018-05-19 DIAGNOSIS — N401 Enlarged prostate with lower urinary tract symptoms: Secondary | ICD-10-CM | POA: Diagnosis not present

## 2018-05-19 DIAGNOSIS — E1165 Type 2 diabetes mellitus with hyperglycemia: Secondary | ICD-10-CM | POA: Diagnosis not present

## 2018-05-19 DIAGNOSIS — E119 Type 2 diabetes mellitus without complications: Secondary | ICD-10-CM | POA: Diagnosis not present

## 2018-05-19 DIAGNOSIS — W19XXXD Unspecified fall, subsequent encounter: Secondary | ICD-10-CM | POA: Diagnosis not present

## 2018-05-19 DIAGNOSIS — F5104 Psychophysiologic insomnia: Secondary | ICD-10-CM | POA: Diagnosis not present

## 2018-05-23 ENCOUNTER — Other Ambulatory Visit: Payer: Self-pay | Admitting: Internal Medicine

## 2018-05-23 DIAGNOSIS — R413 Other amnesia: Secondary | ICD-10-CM

## 2018-05-25 ENCOUNTER — Other Ambulatory Visit: Payer: Self-pay

## 2018-05-25 ENCOUNTER — Ambulatory Visit: Payer: Medicare PPO | Attending: Cardiology

## 2018-05-25 ENCOUNTER — Encounter: Payer: Self-pay | Admitting: Neurology

## 2018-05-25 DIAGNOSIS — R2689 Other abnormalities of gait and mobility: Secondary | ICD-10-CM

## 2018-05-25 DIAGNOSIS — M6281 Muscle weakness (generalized): Secondary | ICD-10-CM | POA: Insufficient documentation

## 2018-05-25 NOTE — Therapy (Addendum)
[facility] [address], [place], [place], [address] Phone: [phone]   Fax:  [phone]  Physical Therapy Evaluation  Patient Details  Name: Wayne Phillips MRN: [id number] Date of Birth: 18-Jun-1941 Referring Provider: Little Ishikawa, PA   Encounter Date: 05/25/2018  PT End of Session - 05/25/18 1053    Visit Number  1    Date for PT Re-Evaluation  07/20/18    Authorization Type  Medicare    PT Start Time  1018    PT Stop Time  1054    PT Time Calculation (min)  36 min    Activity Tolerance  Patient tolerated treatment well    Behavior During Therapy  [facility] for tasks assessed/performed       Past Medical History:  Diagnosis Date  . Chronic kidney disease    Stage III  . CKD (chronic kidney disease) stage 3, GFR 30-59 ml/min (HCC)   . Coronary artery disease   . Depression   . Diverticulosis    left sided  . DJD (degenerative joint disease)   . ED (erectile dysfunction)   . H/O pilonidal cyst   . High cholesterol   . HLD (hyperlipidemia)   . Hypertension   . Low back pain   . MI (myocardial infarction) ([place])   . MI (myocardial infarction) ([place]) 2014  . Prostatitis   . Restless legs     Past Surgical History:  Procedure Laterality Date  . CARDIAC CATHETERIZATION  07/2013   Circumflex 40%, LAD proximal 70%,RCA 50-60%,EF 55%  . COLONOSCOPY    . KNEE ARTHROSCOPY W/ ACL RECONSTRUCTION     left    There were no vitals filed for this visit.   Subjective Assessment - 05/25/18 1022    Subjective  Pt reports to PT with complaints of feeling weak and reduced endurance over the past few years.  Pt reports that his legs feel weak and they dont feel secure.      Patient Stated Goals  improve leg strength, improve sit to stand.    Currently in Pain?  No/denies   none related to diagnosis        [facility] PT Assessment - 05/25/18 0001      Assessment   Medical Diagnosis  weakness, decreased  functional capacity    Referring Provider  Little Ishikawa, PA    Onset Date/Surgical Date  05/25/16    Next MD Visit  October 2019    Prior Therapy  none      Precautions   Precautions  Fall      Restrictions   Weight Bearing Restrictions  No      Balance Screen   Has the patient fallen in the past 6 months  No    Has the patient had a decrease in activity level because of a fear of falling?   No    Is the patient reluctant to leave their home because of a fear of falling?   No      Home Environment   Living Environment  Private residence    Living Arrangements  Spouse/significant other    Type of [place] to enter    Home Layout  Two level      Prior Function   Level of Independence  Independent    Vocation  Retired    Leisure  fixes cars      Cognition   Overall  Cognitive Status  Within Functional Limits for tasks assessed      Posture/Postural Control   Posture/Postural Control  Postural limitations    Postural Limitations  Flexed trunk;Forward head      ROM / Strength   AROM / PROM / Strength  AROM;PROM;Strength      AROM   Overall AROM   Within functional limits for tasks performed      PROM   Overall PROM   Deficits    Overall PROM Comments  hip IR limited by 50%, reduced hamstring length with long arc quads      Strength   Overall Strength  Deficits    Strength Assessment Site  Knee;Hip;Ankle    Right/Left Hip  Right;Left    Right Hip Flexion  4-/5    Right Hip External Rotation   4/5    Right Hip Internal Rotation  4/5    Left Hip Flexion  4-/5    Left Hip External Rotation  4/5    Left Hip Internal Rotation  4/5    Right/Left Knee  Right;Left    Right Knee Flexion  4+/5    Right Knee Extension  4/5   lag   Left Knee Flexion  4+/5    Left Knee Extension  4/5   lag   Right/Left Ankle  Left;Right    Right Ankle Dorsiflexion  5/5    Left Ankle Dorsiflexion  5/5      Transfers   Transfers  Sit to Stand;Stand to  Sit    Sit to Stand  6: Modified independent (Device/Increase time)    Five time sit to stand comments   27.3 seconds    Stand to Sit  6: Modified independent (Device/Increase time)      Ambulation/Gait   Ambulation/Gait  Yes    Gait Pattern  Step-through pattern;Decreased stride length    Stairs  Yes    Stair Management Technique  One rail Right;Alternating pattern    Gait Comments  scissoring with turning a corner      6 Minute Walk- Baseline   6 Minute Walk- Baseline  no   470 feet     6 minute walk test results    Aerobic Endurance Distance Walked  470                Objective measurements completed on examination: See above findings.              PT Education - 05/25/18 1047    Education Details   Access Code: [email or web address] and walking program progression    Person(s) Educated  Patient    Methods  Explanation;Demonstration;Handout    Comprehension  Verbalized understanding;Returned demonstration       PT Short Term Goals - 05/25/18 1055      PT SHORT TERM GOAL #1   Title  be independent in initial HEP    Time  4    Period  Weeks    Status  New    Target Date  06/22/18      PT SHORT TERM GOAL #2   Title  perform 5x sit to stand in < or = to 20 seconds to reduce falls risk    Time  4    Period  Weeks    Status  New    Target Date  06/22/18      PT SHORT TERM GOAL #3   Title  ambulate 550 feet in 3 minutes to improve endurance for community ambulation  Time  4    Period  Weeks    Status  New    Target Date  06/22/18        PT Long Term Goals - 05/25/18 1058      PT LONG TERM GOAL #1   Title  be independent in advanced HEP    Time  8    Period  Weeks    Status  New    Target Date  07/20/18      PT LONG TERM GOAL #2   Title  perform 5x sit to stand in < or = to 17 seconds to reduce falls risk    Time  8    Period  Weeks    Status  New    Target Date  07/20/18      PT LONG TERM GOAL #3   Title  ambulate 600 feet in 3 minutes to  improve community endurance    Time  8    Period  Weeks    Status  New    Target Date  07/20/18      PT LONG TERM GOAL #4   Title  initiate a regular walking routine for endurance gains and verbalize understanding how to progress safely    Time  8    Period  Weeks    Status  New    Target Date  07/20/18             Plan - 05/25/18 1134    Clinical Impression Statement  Pt reports to PT with ~2 year history of declining strength and endurance.  Pt denies any falls and denies any regular physical activity.  Pt reports that he gets short of breath with activity at times.  Pt ambulates on level surface with shortened step length and demonstrates scissoring with turning a corner/change of direction.  Pt ambulates 470 feet in 3 minutes today with mild shortness of breath upon completion.  5x sit to stand is performed in 27.3 seconds indicating high risk of falls.  Pt with decreased strength in bil hips and demonstrates quad lag bilaterally.  Pt will benefit from skilled PT to address endurance and strength deficits and address balance to improve safety and stability at home and in the community.      History and Personal Factors relevant to plan of care:  cardiovascular disease    Clinical Presentation  Stable    Clinical Presentation due to:  stable decline of strength associated with inactivity, has family support    Clinical Decision Making  Low    Rehab Potential  Good    PT Frequency  2x / week    PT Duration  8 weeks    PT Treatment/Interventions  ADLs/Self Care Home Management;Gait training;Stair training;Functional mobility training;Therapeutic activities;Therapeutic exercise;Balance training;Patient/family education;Neuromuscular re-education;Manual techniques;Passive range of motion    PT Next Visit Plan  endurance, balance, gait     PT Home Exercise Plan  Access Code: [email or web address]     Consulted and Agree with Plan of Care  Patient       Patient will benefit from skilled  therapeutic intervention in order to improve the following deficits and impairments:  Abnormal gait, Postural dysfunction, Decreased activity tolerance, Decreased endurance, Decreased strength, Decreased range of motion, Decreased balance, Impaired flexibility  Visit Diagnosis: Muscle weakness (generalized) - Plan: PT plan of care cert/re-cert  Other abnormalities of gait and mobility - Plan: PT plan of care cert/re-cert     Problem List Patient Active  Problem List   Diagnosis Date Noted  . Pyrexia   . Pyuria 05/21/2015  . Sepsis ([place]) 05/21/2015     Wayne Phillips, PT 05/25/18 11:39 AM  [facility] [address], [place], [place], [address] Phone: [phone]   Fax:  [phone]  Name: Wayne Phillips MRN: [id number] Date of Birth: Jul 12, 1941  PHYSICAL THERAPY DISCHARGE SUMMARY  Visits from Start of Care: 1  Current functional level related to goals / functional outcomes: See above only came to eval   Remaining deficits: See above   Education / Equipment: See above  Plan: Patient agrees to discharge.  Patient goals were not met. Patient is being discharged due to not returning since the last visit.  ?????    Wayne Phillips, PT 06/22/18 9:50 AM

## 2018-05-25 NOTE — Patient Instructions (Signed)
Access Code: [email or web address]  URL: [email or web address]  Date: 05/25/2018  Prepared by: Lorrene ReidKelly Takacs   Exercises  Sit to Stand with Arms Crossed - 5 reps - 2 sets - 2x daily - 7x weekly  Seated Knee Extension AROM - 10 reps - 1 sets - 5 hold - 2x daily - 7x weekly  Seated March - 10 reps - 2 sets - 3x daily - 7x weekly   WALKING  Walking is a great form of exercise to increase your strength, endurance and overall fitness.  A walking program can help you start slowly and gradually build endurance as you go.  Everyone's ability is different, so each person's starting point will be different.  You do not have to follow them exactly.  The are just samples. You should simply find out what's right for you and stick to that program.   In the beginning, you'll start off walking 2-3 times a day for short distances.  As you get stronger, you'll be walking further at just 1-2 times per day.  A. You Can Walk For A Certain Length Of Time Each Day    Walk 5 minutes 3 times per day.  Increase 2 minutes every 2 days (3 times per day).  Work up to 25-30 minutes (1-2 times per day).   Example:   Day 1-2 5 minutes 3 times per day   Day 7-8 12 minutes 2-3 times per day   Day 13-14 25 minutes 1-2 times per day  B. You Can Walk For a Certain Distance Each Day     Distance can be substituted for time.    Example:   3 trips to mailbox (at road)   3 trips to corner of block   3 trips around the block  C. Go to local [employer] school and use the track.    Walk for distance ____ around track  Or time ____ minutes  D. Walk ____ Jog ____ Run ___  Please only do the exercises that your therapist has initialed and dated

## 2018-05-27 ENCOUNTER — Ambulatory Visit
Admission: RE | Admit: 2018-05-27 | Discharge: 2018-05-27 | Disposition: A | Discharge status: Home / Self Care | Payer: Medicare PPO | Source: Ambulatory Visit | Attending: Internal Medicine | Admitting: Internal Medicine

## 2018-05-27 DIAGNOSIS — R413 Other amnesia: Secondary | ICD-10-CM

## 2018-06-01 DIAGNOSIS — N401 Enlarged prostate with lower urinary tract symptoms: Secondary | ICD-10-CM | POA: Diagnosis not present

## 2018-06-01 DIAGNOSIS — R350 Frequency of micturition: Secondary | ICD-10-CM | POA: Diagnosis not present

## 2018-06-01 DIAGNOSIS — R3912 Poor urinary stream: Secondary | ICD-10-CM | POA: Diagnosis not present

## 2018-06-06 ENCOUNTER — Ambulatory Visit: Payer: Medicare PPO | Admitting: Physical Therapy

## 2018-06-06 DIAGNOSIS — F5101 Primary insomnia: Secondary | ICD-10-CM | POA: Diagnosis not present

## 2018-06-06 DIAGNOSIS — Z7689 Persons encountering health services in other specified circumstances: Secondary | ICD-10-CM | POA: Diagnosis not present

## 2018-06-06 DIAGNOSIS — I10 Essential (primary) hypertension: Secondary | ICD-10-CM | POA: Diagnosis not present

## 2018-06-06 DIAGNOSIS — N183 Chronic kidney disease, stage 3 (moderate): Secondary | ICD-10-CM | POA: Diagnosis not present

## 2018-06-06 DIAGNOSIS — E782 Mixed hyperlipidemia: Secondary | ICD-10-CM | POA: Diagnosis not present

## 2018-06-06 DIAGNOSIS — E119 Type 2 diabetes mellitus without complications: Secondary | ICD-10-CM | POA: Diagnosis not present

## 2018-06-06 DIAGNOSIS — Z79899 Other long term (current) drug therapy: Secondary | ICD-10-CM | POA: Diagnosis not present

## 2018-06-08 ENCOUNTER — Encounter: Payer: Self-pay | Admitting: Physical Therapy

## 2018-06-13 DIAGNOSIS — F5104 Psychophysiologic insomnia: Secondary | ICD-10-CM | POA: Diagnosis not present

## 2018-06-13 DIAGNOSIS — N401 Enlarged prostate with lower urinary tract symptoms: Secondary | ICD-10-CM | POA: Diagnosis not present

## 2018-06-13 DIAGNOSIS — E039 Hypothyroidism, unspecified: Secondary | ICD-10-CM | POA: Diagnosis not present

## 2018-06-13 DIAGNOSIS — Z23 Encounter for immunization: Secondary | ICD-10-CM | POA: Diagnosis not present

## 2018-06-13 DIAGNOSIS — R413 Other amnesia: Secondary | ICD-10-CM | POA: Diagnosis not present

## 2018-06-16 ENCOUNTER — Encounter: Payer: Self-pay | Admitting: Physical Therapy

## 2018-06-22 ENCOUNTER — Ambulatory Visit: Payer: Medicare PPO | Attending: Cardiology | Admitting: Physical Therapy

## 2018-06-22 ENCOUNTER — Telehealth: Payer: Self-pay | Admitting: Physical Therapy

## 2018-06-22 NOTE — Telephone Encounter (Signed)
Patient did not show for appointment.  Patient was called and there was no answer.  Patient has cancelled last 5 visits and will be discharged per policy.  Vincente Poli, PT 06/22/18 9:47 AM

## 2018-06-29 ENCOUNTER — Encounter: Payer: Self-pay | Admitting: Physical Therapy

## 2018-06-29 DIAGNOSIS — N401 Enlarged prostate with lower urinary tract symptoms: Secondary | ICD-10-CM | POA: Diagnosis not present

## 2018-06-29 DIAGNOSIS — R351 Nocturia: Secondary | ICD-10-CM | POA: Diagnosis not present

## 2018-06-29 DIAGNOSIS — R350 Frequency of micturition: Secondary | ICD-10-CM | POA: Diagnosis not present

## 2018-06-29 DIAGNOSIS — Z79899 Other long term (current) drug therapy: Secondary | ICD-10-CM | POA: Diagnosis not present

## 2018-07-17 ENCOUNTER — Ambulatory Visit: Payer: Self-pay | Admitting: Neurology

## 2018-07-17 ENCOUNTER — Encounter

## 2018-09-29 DIAGNOSIS — E119 Type 2 diabetes mellitus without complications: Secondary | ICD-10-CM | POA: Diagnosis not present

## 2018-09-29 DIAGNOSIS — H2513 Age-related nuclear cataract, bilateral: Secondary | ICD-10-CM | POA: Diagnosis not present

## 2018-09-29 DIAGNOSIS — H40013 Open angle with borderline findings, low risk, bilateral: Secondary | ICD-10-CM | POA: Diagnosis not present

## 2018-09-29 DIAGNOSIS — H40033 Anatomical narrow angle, bilateral: Secondary | ICD-10-CM | POA: Diagnosis not present

## 2018-09-30 DIAGNOSIS — R531 Weakness: Secondary | ICD-10-CM | POA: Diagnosis not present

## 2018-10-16 DIAGNOSIS — M25561 Pain in right knee: Secondary | ICD-10-CM | POA: Diagnosis not present

## 2018-10-16 DIAGNOSIS — Z79899 Other long term (current) drug therapy: Secondary | ICD-10-CM | POA: Diagnosis not present

## 2018-10-16 DIAGNOSIS — I10 Essential (primary) hypertension: Secondary | ICD-10-CM | POA: Diagnosis not present

## 2018-10-16 DIAGNOSIS — E039 Hypothyroidism, unspecified: Secondary | ICD-10-CM | POA: Diagnosis not present

## 2018-10-16 DIAGNOSIS — N183 Chronic kidney disease, stage 3 (moderate): Secondary | ICD-10-CM | POA: Diagnosis not present

## 2018-10-16 DIAGNOSIS — F341 Dysthymic disorder: Secondary | ICD-10-CM | POA: Diagnosis not present

## 2018-10-16 DIAGNOSIS — F5101 Primary insomnia: Secondary | ICD-10-CM | POA: Diagnosis not present

## 2018-10-16 DIAGNOSIS — E119 Type 2 diabetes mellitus without complications: Secondary | ICD-10-CM | POA: Diagnosis not present

## 2018-10-16 DIAGNOSIS — M545 Low back pain: Secondary | ICD-10-CM | POA: Diagnosis not present

## 2018-11-21 DIAGNOSIS — K121 Other forms of stomatitis: Secondary | ICD-10-CM | POA: Diagnosis not present

## 2018-11-23 DIAGNOSIS — E782 Mixed hyperlipidemia: Secondary | ICD-10-CM | POA: Diagnosis not present

## 2018-11-23 DIAGNOSIS — I10 Essential (primary) hypertension: Secondary | ICD-10-CM | POA: Diagnosis not present

## 2018-11-23 DIAGNOSIS — R296 Repeated falls: Secondary | ICD-10-CM | POA: Diagnosis not present

## 2018-11-23 DIAGNOSIS — Z7409 Other reduced mobility: Secondary | ICD-10-CM | POA: Diagnosis not present

## 2018-11-23 DIAGNOSIS — R29898 Other symptoms and signs involving the musculoskeletal system: Secondary | ICD-10-CM | POA: Diagnosis not present

## 2018-11-23 DIAGNOSIS — I359 Nonrheumatic aortic valve disorder, unspecified: Secondary | ICD-10-CM | POA: Diagnosis not present

## 2018-11-23 DIAGNOSIS — N183 Chronic kidney disease, stage 3 (moderate): Secondary | ICD-10-CM | POA: Diagnosis not present

## 2018-11-23 DIAGNOSIS — R4189 Other symptoms and signs involving cognitive functions and awareness: Secondary | ICD-10-CM | POA: Diagnosis not present

## 2018-11-23 DIAGNOSIS — I2510 Atherosclerotic heart disease of native coronary artery without angina pectoris: Secondary | ICD-10-CM | POA: Diagnosis not present

## 2018-11-24 DIAGNOSIS — I2510 Atherosclerotic heart disease of native coronary artery without angina pectoris: Secondary | ICD-10-CM | POA: Diagnosis not present

## 2018-11-24 DIAGNOSIS — R4189 Other symptoms and signs involving cognitive functions and awareness: Secondary | ICD-10-CM | POA: Diagnosis not present

## 2018-11-24 DIAGNOSIS — I359 Nonrheumatic aortic valve disorder, unspecified: Secondary | ICD-10-CM | POA: Diagnosis not present

## 2018-11-24 DIAGNOSIS — I350 Nonrheumatic aortic (valve) stenosis: Secondary | ICD-10-CM | POA: Diagnosis not present

## 2018-11-24 DIAGNOSIS — I517 Cardiomegaly: Secondary | ICD-10-CM | POA: Diagnosis not present

## 2018-11-28 DIAGNOSIS — M25561 Pain in right knee: Secondary | ICD-10-CM | POA: Diagnosis not present

## 2018-11-28 DIAGNOSIS — R262 Difficulty in walking, not elsewhere classified: Secondary | ICD-10-CM | POA: Diagnosis not present

## 2018-11-28 DIAGNOSIS — M545 Low back pain: Secondary | ICD-10-CM | POA: Diagnosis not present

## 2018-11-28 DIAGNOSIS — M542 Cervicalgia: Secondary | ICD-10-CM | POA: Diagnosis not present

## 2018-11-28 DIAGNOSIS — M6281 Muscle weakness (generalized): Secondary | ICD-10-CM | POA: Diagnosis not present

## 2018-12-07 DIAGNOSIS — M545 Low back pain: Secondary | ICD-10-CM | POA: Diagnosis not present

## 2018-12-07 DIAGNOSIS — M25561 Pain in right knee: Secondary | ICD-10-CM | POA: Diagnosis not present

## 2018-12-07 DIAGNOSIS — R262 Difficulty in walking, not elsewhere classified: Secondary | ICD-10-CM | POA: Diagnosis not present

## 2018-12-07 DIAGNOSIS — M6281 Muscle weakness (generalized): Secondary | ICD-10-CM | POA: Diagnosis not present

## 2018-12-07 DIAGNOSIS — M542 Cervicalgia: Secondary | ICD-10-CM | POA: Diagnosis not present

## 2018-12-12 DIAGNOSIS — M25561 Pain in right knee: Secondary | ICD-10-CM | POA: Diagnosis not present

## 2018-12-12 DIAGNOSIS — M545 Low back pain: Secondary | ICD-10-CM | POA: Diagnosis not present

## 2018-12-12 DIAGNOSIS — M542 Cervicalgia: Secondary | ICD-10-CM | POA: Diagnosis not present

## 2018-12-12 DIAGNOSIS — M6281 Muscle weakness (generalized): Secondary | ICD-10-CM | POA: Diagnosis not present

## 2018-12-12 DIAGNOSIS — R262 Difficulty in walking, not elsewhere classified: Secondary | ICD-10-CM | POA: Diagnosis not present

## 2018-12-14 DIAGNOSIS — M545 Low back pain: Secondary | ICD-10-CM | POA: Diagnosis not present

## 2018-12-14 DIAGNOSIS — M25561 Pain in right knee: Secondary | ICD-10-CM | POA: Diagnosis not present

## 2018-12-14 DIAGNOSIS — M6281 Muscle weakness (generalized): Secondary | ICD-10-CM | POA: Diagnosis not present

## 2018-12-14 DIAGNOSIS — M542 Cervicalgia: Secondary | ICD-10-CM | POA: Diagnosis not present

## 2018-12-14 DIAGNOSIS — R262 Difficulty in walking, not elsewhere classified: Secondary | ICD-10-CM | POA: Diagnosis not present

## 2019-01-04 DIAGNOSIS — I739 Peripheral vascular disease, unspecified: Secondary | ICD-10-CM | POA: Diagnosis not present

## 2019-01-04 DIAGNOSIS — E039 Hypothyroidism, unspecified: Secondary | ICD-10-CM | POA: Diagnosis not present

## 2019-01-04 DIAGNOSIS — I129 Hypertensive chronic kidney disease with stage 1 through stage 4 chronic kidney disease, or unspecified chronic kidney disease: Secondary | ICD-10-CM | POA: Diagnosis not present

## 2019-01-04 DIAGNOSIS — E1151 Type 2 diabetes mellitus with diabetic peripheral angiopathy without gangrene: Secondary | ICD-10-CM | POA: Diagnosis not present

## 2019-01-04 DIAGNOSIS — E782 Mixed hyperlipidemia: Secondary | ICD-10-CM | POA: Diagnosis not present

## 2019-01-04 DIAGNOSIS — E1122 Type 2 diabetes mellitus with diabetic chronic kidney disease: Secondary | ICD-10-CM | POA: Diagnosis not present

## 2019-01-04 DIAGNOSIS — I2510 Atherosclerotic heart disease of native coronary artery without angina pectoris: Secondary | ICD-10-CM | POA: Diagnosis not present

## 2019-01-04 DIAGNOSIS — N401 Enlarged prostate with lower urinary tract symptoms: Secondary | ICD-10-CM | POA: Diagnosis not present

## 2019-01-04 DIAGNOSIS — N183 Chronic kidney disease, stage 3 (moderate): Secondary | ICD-10-CM | POA: Diagnosis not present

## 2019-01-12 DIAGNOSIS — H2510 Age-related nuclear cataract, unspecified eye: Secondary | ICD-10-CM | POA: Diagnosis not present

## 2019-01-31 DIAGNOSIS — F5104 Psychophysiologic insomnia: Secondary | ICD-10-CM | POA: Diagnosis not present

## 2019-01-31 DIAGNOSIS — F325 Major depressive disorder, single episode, in full remission: Secondary | ICD-10-CM | POA: Diagnosis not present

## 2019-02-07 DIAGNOSIS — R300 Dysuria: Secondary | ICD-10-CM | POA: Diagnosis not present

## 2019-02-27 DIAGNOSIS — R300 Dysuria: Secondary | ICD-10-CM | POA: Diagnosis not present

## 2019-03-01 DIAGNOSIS — M25561 Pain in right knee: Secondary | ICD-10-CM | POA: Diagnosis not present

## 2019-03-06 DIAGNOSIS — R350 Frequency of micturition: Secondary | ICD-10-CM | POA: Diagnosis not present

## 2019-03-06 DIAGNOSIS — R351 Nocturia: Secondary | ICD-10-CM | POA: Diagnosis not present

## 2019-03-06 DIAGNOSIS — N3020 Other chronic cystitis without hematuria: Secondary | ICD-10-CM | POA: Diagnosis not present

## 2019-03-06 DIAGNOSIS — N401 Enlarged prostate with lower urinary tract symptoms: Secondary | ICD-10-CM | POA: Diagnosis not present

## 2019-03-15 DIAGNOSIS — E039 Hypothyroidism, unspecified: Secondary | ICD-10-CM | POA: Diagnosis not present

## 2019-03-15 DIAGNOSIS — I2510 Atherosclerotic heart disease of native coronary artery without angina pectoris: Secondary | ICD-10-CM | POA: Diagnosis not present

## 2019-03-15 DIAGNOSIS — E782 Mixed hyperlipidemia: Secondary | ICD-10-CM | POA: Diagnosis not present

## 2019-03-15 DIAGNOSIS — E1151 Type 2 diabetes mellitus with diabetic peripheral angiopathy without gangrene: Secondary | ICD-10-CM | POA: Diagnosis not present

## 2019-03-15 DIAGNOSIS — E1122 Type 2 diabetes mellitus with diabetic chronic kidney disease: Secondary | ICD-10-CM | POA: Diagnosis not present

## 2019-03-15 DIAGNOSIS — N183 Chronic kidney disease, stage 3 (moderate): Secondary | ICD-10-CM | POA: Diagnosis not present

## 2019-03-15 DIAGNOSIS — F5104 Psychophysiologic insomnia: Secondary | ICD-10-CM | POA: Diagnosis not present

## 2019-03-15 DIAGNOSIS — I129 Hypertensive chronic kidney disease with stage 1 through stage 4 chronic kidney disease, or unspecified chronic kidney disease: Secondary | ICD-10-CM | POA: Diagnosis not present

## 2019-03-15 DIAGNOSIS — I739 Peripheral vascular disease, unspecified: Secondary | ICD-10-CM | POA: Diagnosis not present

## 2019-03-28 DIAGNOSIS — R531 Weakness: Secondary | ICD-10-CM | POA: Diagnosis not present

## 2019-04-02 DIAGNOSIS — N329 Bladder disorder, unspecified: Secondary | ICD-10-CM | POA: Diagnosis not present

## 2019-04-02 DIAGNOSIS — N135 Crossing vessel and stricture of ureter without hydronephrosis: Secondary | ICD-10-CM | POA: Diagnosis not present

## 2019-04-02 DIAGNOSIS — N281 Cyst of kidney, acquired: Secondary | ICD-10-CM | POA: Diagnosis not present

## 2019-04-02 DIAGNOSIS — J90 Pleural effusion, not elsewhere classified: Secondary | ICD-10-CM | POA: Diagnosis not present

## 2019-04-06 DIAGNOSIS — R350 Frequency of micturition: Secondary | ICD-10-CM | POA: Diagnosis not present

## 2019-04-06 DIAGNOSIS — N3020 Other chronic cystitis without hematuria: Secondary | ICD-10-CM | POA: Diagnosis not present

## 2019-04-06 DIAGNOSIS — R351 Nocturia: Secondary | ICD-10-CM | POA: Diagnosis not present

## 2019-04-06 DIAGNOSIS — N401 Enlarged prostate with lower urinary tract symptoms: Secondary | ICD-10-CM | POA: Diagnosis not present

## 2019-05-17 DIAGNOSIS — R300 Dysuria: Secondary | ICD-10-CM | POA: Diagnosis not present

## 2019-05-17 DIAGNOSIS — E039 Hypothyroidism, unspecified: Secondary | ICD-10-CM | POA: Diagnosis not present

## 2019-05-24 DIAGNOSIS — R0602 Shortness of breath: Secondary | ICD-10-CM | POA: Diagnosis not present

## 2019-05-24 DIAGNOSIS — N183 Chronic kidney disease, stage 3 (moderate): Secondary | ICD-10-CM | POA: Diagnosis not present

## 2019-05-24 DIAGNOSIS — R0683 Snoring: Secondary | ICD-10-CM | POA: Diagnosis not present

## 2019-05-24 DIAGNOSIS — R4189 Other symptoms and signs involving cognitive functions and awareness: Secondary | ICD-10-CM | POA: Diagnosis not present

## 2019-05-24 DIAGNOSIS — I129 Hypertensive chronic kidney disease with stage 1 through stage 4 chronic kidney disease, or unspecified chronic kidney disease: Secondary | ICD-10-CM | POA: Diagnosis not present

## 2019-05-24 DIAGNOSIS — I2510 Atherosclerotic heart disease of native coronary artery without angina pectoris: Secondary | ICD-10-CM | POA: Diagnosis not present

## 2019-05-24 DIAGNOSIS — Z0189 Encounter for other specified special examinations: Secondary | ICD-10-CM | POA: Diagnosis not present

## 2019-05-24 DIAGNOSIS — E782 Mixed hyperlipidemia: Secondary | ICD-10-CM | POA: Diagnosis not present

## 2019-05-24 DIAGNOSIS — I359 Nonrheumatic aortic valve disorder, unspecified: Secondary | ICD-10-CM | POA: Diagnosis not present

## 2019-05-24 DIAGNOSIS — I10 Essential (primary) hypertension: Secondary | ICD-10-CM | POA: Diagnosis not present

## 2019-06-14 DIAGNOSIS — G3184 Mild cognitive impairment, so stated: Secondary | ICD-10-CM | POA: Diagnosis not present

## 2019-06-14 DIAGNOSIS — G479 Sleep disorder, unspecified: Secondary | ICD-10-CM | POA: Diagnosis not present

## 2019-06-14 DIAGNOSIS — E569 Vitamin deficiency, unspecified: Secondary | ICD-10-CM | POA: Diagnosis not present

## 2019-06-14 DIAGNOSIS — Z8679 Personal history of other diseases of the circulatory system: Secondary | ICD-10-CM | POA: Diagnosis not present

## 2019-06-22 DIAGNOSIS — R350 Frequency of micturition: Secondary | ICD-10-CM | POA: Diagnosis not present

## 2019-06-22 DIAGNOSIS — N390 Urinary tract infection, site not specified: Secondary | ICD-10-CM | POA: Diagnosis not present

## 2019-06-22 DIAGNOSIS — N400 Enlarged prostate without lower urinary tract symptoms: Secondary | ICD-10-CM | POA: Diagnosis not present

## 2019-06-25 DIAGNOSIS — N401 Enlarged prostate with lower urinary tract symptoms: Secondary | ICD-10-CM | POA: Diagnosis not present

## 2019-06-25 DIAGNOSIS — R4189 Other symptoms and signs involving cognitive functions and awareness: Secondary | ICD-10-CM | POA: Diagnosis not present

## 2019-06-25 DIAGNOSIS — F341 Dysthymic disorder: Secondary | ICD-10-CM | POA: Diagnosis not present

## 2019-06-25 DIAGNOSIS — Z7189 Other specified counseling: Secondary | ICD-10-CM | POA: Diagnosis not present

## 2019-06-25 DIAGNOSIS — I951 Orthostatic hypotension: Secondary | ICD-10-CM | POA: Diagnosis not present

## 2019-06-25 DIAGNOSIS — I10 Essential (primary) hypertension: Secondary | ICD-10-CM | POA: Diagnosis not present

## 2019-07-09 DIAGNOSIS — E039 Hypothyroidism, unspecified: Secondary | ICD-10-CM | POA: Diagnosis not present

## 2019-07-30 DIAGNOSIS — G3184 Mild cognitive impairment, so stated: Secondary | ICD-10-CM | POA: Diagnosis not present

## 2019-08-30 ENCOUNTER — Encounter: Payer: Self-pay | Admitting: Neurology

## 2019-09-14 ENCOUNTER — Encounter: Payer: Self-pay | Admitting: Neurology

## 2019-09-14 ENCOUNTER — Ambulatory Visit (INDEPENDENT_AMBULATORY_CARE_PROVIDER_SITE_OTHER): Payer: Medicare PPO | Admitting: Neurology

## 2019-09-14 ENCOUNTER — Other Ambulatory Visit: Payer: Self-pay

## 2019-09-14 VITALS — BP 127/69 | HR 87 | Ht 73.0 in | Wt 243.0 lb

## 2019-09-14 DIAGNOSIS — F0390 Unspecified dementia without behavioral disturbance: Secondary | ICD-10-CM | POA: Diagnosis not present

## 2019-09-14 DIAGNOSIS — R2681 Unsteadiness on feet: Secondary | ICD-10-CM

## 2019-09-14 DIAGNOSIS — F03A0 Unspecified dementia, mild, without behavioral disturbance, psychotic disturbance, mood disturbance, and anxiety: Secondary | ICD-10-CM

## 2019-09-14 MED ORDER — DONEPEZIL HCL 10 MG PO TABS: ORAL_TABLET | ORAL | 11 refills | Status: AC

## 2019-09-14 NOTE — Patient Instructions (Addendum)
It was great meeting you! 1. Start Donepezil (Aricept) 10mg : take 1/2 tablet daily for 2 weeks, then increase to 1 tablet daily  2. Refer for home PT for balance and gait therapy. [facility] will call you within 2 weeks to schedule a visit.  3. Proceed with sleep study when able  4. Follow-up in 6 months, call for any changes  FALL PRECAUTIONS: Be cautious when walking. Scan the area for obstacles that may increase the risk of trips and falls. When getting up in the mornings, sit up at the edge of the bed for a few minutes before getting out of bed. Consider elevating the bed at the head end to avoid drop of blood pressure when getting up. Walk always in a well-lit room (use night lights in the walls). Avoid area rugs or power cords from appliances in the middle of the walkways. Use a walker or a cane if necessary and consider physical therapy for balance exercise. Get your eyesight checked regularly.  FINANCIAL OVERSIGHT: Supervision, especially oversight when making financial decisions or transactions is also recommended.  HOME SAFETY: Consider the safety of the kitchen when operating appliances like stoves, microwave oven, and blender. Consider having supervision and share cooking responsibilities until no longer able to participate in those. Accidents with firearms and other hazards in the house should be identified and addressed as well.  DRIVING: Regarding driving, in patients with progressive memory problems, driving will be impaired. We advise to have someone else do the driving if trouble finding directions or if minor accidents are reported. Independent driving assessment is available to determine safety of driving.  ABILITY TO BE LEFT ALONE: If patient is unable to contact 911 operator, consider using LifeLine, or when the need is there, arrange for someone to stay with patients. Smoking is a fire hazard, consider supervision or cessation. Risk of wandering should be assessed by  caregiver and if detected at any point, supervision and safe proof recommendations should be instituted.  MEDICATION SUPERVISION: Inability to self-administer medication needs to be constantly addressed. Implement a mechanism to ensure safe administration of the medications.  RECOMMENDATIONS FOR ALL PATIENTS WITH MEMORY PROBLEMS: 1. Continue to exercise (Recommend 30 minutes of walking everyday, or 3 hours every week) 2. Increase social interactions - continue going to [place] and enjoy social gatherings with friends and family 3. Eat healthy, avoid fried foods and eat more fruits and vegetables 4. Maintain adequate blood pressure, blood sugar, and blood cholesterol level. Reducing the risk of stroke and cardiovascular disease also helps promoting better memory. 5. Avoid stressful situations. Live a simple life and avoid aggravations. Organize your time and prepare for the next day in anticipation. 6. Sleep well, avoid any interruptions of sleep and avoid any distractions in the bedroom that may interfere with adequate sleep quality 7. Avoid sugar, avoid sweets as there is a strong link between excessive sugar intake, diabetes, and cognitive impairment The Mediterranean diet has been shown to help patients reduce the risk of progressive memory disorders and reduces cardiovascular risk. This includes eating fish, eat fruits and green leafy vegetables, nuts like almonds and hazelnuts, walnuts, and also use olive oil. Avoid fast foods and fried foods as much as possible. Avoid sweets and sugar as sugar use has been linked to worsening of memory function.  There is always a concern of gradual progression of memory problems. If this is the case, then we may need to adjust level of care according to patient needs. Support, both  to the patient and caregiver, should then be put into place.

## 2019-09-14 NOTE — Progress Notes (Signed)
NEUROLOGY CONSULTATION NOTE  Wayne Phillips MRN: [id number] DOB: Nov 08, 1940  Referring provider: Dr. Kirby FunkJohn Griffin Primary care provider: Dr. Kirby FunkJohn Griffin  Reason for consult:  Memory loss  Dear Dr Valentina LucksGriffin:  Thank you for your kind referral of Wayne DykesJorge H Phillips for consultation of the above symptoms. Although his history is well known to you, please allow me to reiterate it for the purpose of our medical record. His daughter Wayne Phillips is present during the visit to provide additional information. Records and images were personally reviewed where available.   HISTORY OF PRESENT ILLNESS: This is a pleasant 78 year old right-handed man with a history of hypertension, hyperlipidemia, CKD, RLS, presenting for evaluation of memory loss. He was recently evaluated at [facility] 3 months ago with a MOCA score of 18/30. They would like to establish care locally. He feels his memory is okay. His daughter started noticing memory changes over the past 10-12 months. He lives with his wife. On his [facility], his wife reported a 2-3 year history of progressive memory loss. His wife reported forgetting dates and getting situations mixed up. She would be talking about things that happened and he would not understand what she is talking about. 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 His daughter reports they would have to repeat things for him to comprehend. He asks the same questions repeatedly. He denies getting lost driving, however Wayne Phillips reports he got lost driving to [place] to visit her. In the past 6 months, he had gotten confused a couple of times getting home in [place]. He would call Wayne Phillips asking her why he is talking a medication or if he can stop it, so his wife took over medications 2 years ago. He does not remember this. He manages finances, another daughter in  [place] checks behind him and had only noted one time where he paid a bill twice. He does not cook and denies leaving the stove or faucet on. He states he is pretty organized and denies misplacing things. He denies any word-finding difficulties. He has a history of frequent UTIs over the past 4 years where he becomes very confused and tends to fall, one time he had hallucinations of people in the house. He denies any falls, Wayne Phillips reminds him he fell a couple of months ago. Wayne Phillips reports that he retired all of a sudden 1.5 years ago, which was a big change for him since he has always been busy. He states his mood is "sometimes tired." Wayne Phillips reports he has been sleeping more. His demeanor is more quiet now. He takes naps all day long. He states his sleep is good, but he has told [place] he does not sleep well at night. For a time he was on Ambien long-term, switched to melatonin because he was sedated during the daytime and more confused. His wife thinks he sleeps well but tosses and turns a lot. He has needed more naps since January. No paranoia. His maternal grandmother had dementia. No history of significant head injuries or alcohol use.  He denies any headaches, dizziness, diplopia, dysarthria, dysphagia, neck/back pain, focal numbness/tingling/weakness, bowel dysfunction. No anosmia, tremors. He has urinary frequency. He states he is walking a bit more, but is "not doing anything at home."   He had an MRI brain without contrast Memory protocol done 07/2019 which  showed generalized atrophy with mild temporoparietal predominance.  Laboratory Data: Vitamin B12 in 05/2019 was 699   PAST MEDICAL HISTORY: Past Medical History:  Diagnosis Date  . Chronic kidney disease    Stage III  . CKD (chronic kidney disease) stage 3, GFR 30-59 ml/min (HCC)   . Coronary artery disease   . Depression   . Diverticulosis    left sided  . DJD (degenerative joint disease)   . ED (erectile dysfunction)   . H/O  pilonidal cyst   . High cholesterol   . HLD (hyperlipidemia)   . Hypertension   . Low back pain   . MI (myocardial infarction) (HCC)   . MI (myocardial infarction) (HCC) 2014  . Prostatitis   . Restless legs     PAST SURGICAL HISTORY: Past Surgical History:  Procedure Laterality Date  . CARDIAC CATHETERIZATION  07/2013   Circumflex 40%, LAD proximal 70%,RCA 50-60%,EF 55%  . COLONOSCOPY    . KNEE ARTHROSCOPY W/ ACL RECONSTRUCTION     left    MEDICATIONS: Current Outpatient Medications on File Prior to Visit  Medication Sig Dispense Refill  . acetaminophen (TYLENOL) 500 MG tablet Take 1,000 mg by mouth every 6 (six) hours as needed for moderate pain.    Marland Kitchen aspirin EC 81 MG tablet Take 81 mg by mouth daily.    . cefpodoxime (VANTIN) 100 MG tablet Take 1 tablet (100 mg total) by mouth 2 (two) times daily. For 5 days 10 tablet 0  . citalopram (CELEXA) 20 MG tablet Take 20 mg by mouth daily.    . clopidogrel (PLAVIX) 75 MG tablet Take 75 mg by mouth daily.    . clopidogrel (PLAVIX) 75 MG tablet Take 75 mg by mouth daily.  5  . docusate sodium (COLACE) 100 MG capsule Take 100 mg by mouth at bedtime.    Marland Kitchen ibuprofen (ADVIL,MOTRIN) 200 MG tablet Take 400 mg by mouth every 6 (six) hours as needed. For pain    . metoprolol succinate (TOPROL-XL) 25 MG 24 hr tablet Take 25 mg by mouth daily.    . metoprolol succinate (TOPROL-XL) 25 MG 24 hr tablet Take 25 mg by mouth daily.  3  . oxyCODONE-acetaminophen (PERCOCET/ROXICET) 5-325 MG per tablet Take 1 tablet by mouth every 6 (six) hours as needed.  0  . sertraline (ZOLOFT) 50 MG tablet Take 50 mg by mouth daily.    . traMADol (ULTRAM) 50 MG tablet Take 50 mg by mouth 2 (two) times daily.    . traMADol (ULTRAM) 50 MG tablet Take 50 mg by mouth daily as needed.  5  . zolpidem (AMBIEN) 10 MG tablet Take 10 mg by mouth at bedtime as needed. For insomnia    . zolpidem (AMBIEN) 10 MG tablet Take 10 mg by mouth at bedtime as needed for sleep.   5    No current facility-administered medications on file prior to visit.    ALLERGIES: Allergies  Allergen Reactions  . Crestor [Rosuvastatin]     myalgia  . Novocain [Procaine]   . Novocain [Procaine]     Unknown per daughter, hasnt had since he was a child, is listed on allergy list on other chart that is being merged into this one.  Truitt Leep [Pravastatin Sodium]   . Statins     Myalgias with crestor and zocor and pravachol  . Zetia [Ezetimibe]     GI  . Zocor [Simvastatin]     Myalgia     FAMILY HISTORY: No  family history on file.  SOCIAL HISTORY: Social History   Socioeconomic History  . Marital status: Married    Spouse name: Not on file  . Number of children: Not on file  . Years of education: Not on file  . Highest education level: Not on file  Occupational History  . Not on file  Tobacco Use  . Smoking status: Former Smoker    Types: Cigarettes  . Smokeless tobacco: Never Used  Substance and Sexual Activity  . Alcohol use: No  . Drug use: No  . Sexual activity: Not on file  Other Topics Concern  . Not on file  Social History Narrative   ** Merged History Encounter **       Social Determinants of Health   Financial Resource Strain:   . Difficulty of Paying Living Expenses: Not on file  Food Insecurity:   . Worried About Charity fundraiser in the Last Year: Not on file  . Ran Out of Food in the Last Year: Not on file  Transportation Needs:   . Lack of Transportation (Medical): Not on file  . Lack of Transportation (Non-Medical): Not on file  Physical Activity:   . Days of Exercise per Week: Not on file  . Minutes of Exercise per Session: Not on file  Stress:   . Feeling of Stress : Not on file  Social Connections:   . Frequency of Communication with Friends and Family: Not on file  . Frequency of Social Gatherings with Friends and Family: Not on file  . Attends Religious Services: Not on file  . Active Member of Clubs or Organizations: Not  on file  . Attends Archivist Meetings: Not on file  . Marital Status: Not on file  Intimate Partner Violence:   . Fear of Current or Ex-Partner: Not on file  . Emotionally Abused: Not on file  . Physically Abused: Not on file  . Sexually Abused: Not on file    REVIEW OF SYSTEMS: Constitutional: No fevers, chills, or sweats, no generalized fatigue, change in appetite Eyes: No visual changes, double vision, eye pain Ear, nose and throat: No hearing loss, ear pain, nasal congestion, sore throat Cardiovascular: No chest pain, palpitations Respiratory:  No shortness of breath at rest or with exertion, wheezes GastrointestinaI: No nausea, vomiting, diarrhea, abdominal pain, fecal incontinence Genitourinary:  No dysuria, urinary retention or frequency Musculoskeletal:  No neck pain, back pain Integumentary: No rash, pruritus, skin lesions Neurological: as above Psychiatric: No depression, insomnia, anxiety Endocrine: No palpitations, fatigue, diaphoresis, mood swings, change in appetite, change in weight, increased thirst Hematologic/Lymphatic:  No anemia, purpura, petechiae. Allergic/Immunologic: no itchy/runny eyes, nasal congestion, recent allergic reactions, rashes  PHYSICAL EXAM: Vitals:   09/14/19 1332  BP: 127/69  Pulse: 87  SpO2: 96%   General: No acute distress Head:  Normocephalic/atraumatic Skin/Extremities: No rash, no edema Neurological Exam: Mental status: alert and oriented to person, place, and time, no dysarthria or aphasia, Fund of knowledge is appropriate.  Recent and remote memory are impaired.  Attention and concentration are normal.  SLUMS 15/30 [employer] Mental Exam 09/14/2019  Weekday Correct 1  Current year 1  What state are we in? 1  Amount spent 1  Amount left 2  # of Animals 1  5 objects recall 0  Number series 2  Hour markers 2  Time correct 2  Placed X in triangle correctly 1  Largest Figure 1  Name of male 0  Date back  to work 0  Type of work [employer] she lived in 0  Total score 15    Cranial nerves: CN I: not tested CN II: pupils equal, round and reactive to light, visual fields intact CN III, IV, VI:  full range of motion, no nystagmus, no ptosis CN V: facial sensation intact CN VII: upper and lower face symmetric CN VIII: hearing intact to conversation CN IX, X: gag intact, uvula midline CN XI: sternocleidomastoid and trapezius muscles intact CN XII: tongue midline Bulk & Tone: normal, no fasciculations, no cogwheeling Motor: 5/5 throughout with no pronator drift. Sensation: intact to light touch, cold, pin throughout. Decreased vibration sense to ankles bilaterally. No extinction to double simultaneous stimulation.  Romberg test negative Deep Tendon Reflexes: +2 throughout, no ankle clonus Plantar responses: downgoing bilaterally Cerebellar: no incoordination on finger to nose testing Gait: narrow-based with low clearance (dragging feet) Tremor: no resting tremor. There is mild bilateral postural and endpoint tremor L>R  IMPRESSION: This is a pleasant 78 year old right-handed man with a history of hypertension, hyperlipidemia, CKD, RLS, presenting for evaluation of memory loss. His neurological exam is non-focal with possible mild neuropathy. SLUMS score today 15/30. MRI brain in 07/2019 showed generalized atrophy with mild temporoparietal predominance. 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 We discussed starting Donepezil, including side effects and expectations from medication. Start Donepezil 10mg  1/2 tablet daily for 2 weeks, then increase to 1 tablet daily. He is having more daytime drowsiness, agree with sleep study recommended by his cardiologist as well. He is having more falls with gait instability, home PT will be ordered for balance and gait therapy. We discussed the importance of control of vascular risk  factors, physical exercise, and brain stimulation exercises for brain health. Follow-up in 6 months, they know to call for any changes.   Thank you for allowing me to participate in the care of this patient. Please do not hesitate to call for any questions or concerns.   , M.D.  CC: Dr. Patrcia Dolly

## 2019-09-17 ENCOUNTER — Telehealth: Payer: Self-pay

## 2019-09-17 NOTE — Telephone Encounter (Signed)
Per Dr. Delice Lesch pt requires home PT for gait training and balance therapy. Sent orders to [facility].  Per Reche Dixon:  We unfortunately we are not going to see him. Our staffing in that zip code is very tight.    New referral sent to [facility] today.

## 2019-09-19 ENCOUNTER — Telehealth: Payer: Self-pay

## 2019-09-19 NOTE — Telephone Encounter (Addendum)
[facility] does not provide Home Health.  Will start referral to [facility]. [place] Staff message today.

## 2019-09-24 DIAGNOSIS — I2510 Atherosclerotic heart disease of native coronary artery without angina pectoris: Secondary | ICD-10-CM | POA: Diagnosis not present

## 2019-09-24 DIAGNOSIS — F329 Major depressive disorder, single episode, unspecified: Secondary | ICD-10-CM | POA: Diagnosis not present

## 2019-09-24 DIAGNOSIS — F0390 Unspecified dementia without behavioral disturbance: Secondary | ICD-10-CM | POA: Diagnosis not present

## 2019-09-24 DIAGNOSIS — I129 Hypertensive chronic kidney disease with stage 1 through stage 4 chronic kidney disease, or unspecified chronic kidney disease: Secondary | ICD-10-CM | POA: Diagnosis not present

## 2019-09-24 DIAGNOSIS — G2581 Restless legs syndrome: Secondary | ICD-10-CM | POA: Diagnosis not present

## 2019-09-24 DIAGNOSIS — M545 Low back pain: Secondary | ICD-10-CM | POA: Diagnosis not present

## 2019-09-24 DIAGNOSIS — M1990 Unspecified osteoarthritis, unspecified site: Secondary | ICD-10-CM | POA: Diagnosis not present

## 2019-09-24 DIAGNOSIS — N183 Chronic kidney disease, stage 3 unspecified: Secondary | ICD-10-CM | POA: Diagnosis not present

## 2019-09-24 DIAGNOSIS — M25561 Pain in right knee: Secondary | ICD-10-CM | POA: Diagnosis not present

## 2019-09-25 DIAGNOSIS — Z20828 Contact with and (suspected) exposure to other viral communicable diseases: Secondary | ICD-10-CM | POA: Diagnosis not present

## 2019-09-27 ENCOUNTER — Emergency Department (HOSPITAL_COMMUNITY): Payer: Medicare PPO

## 2019-09-27 ENCOUNTER — Other Ambulatory Visit: Payer: Self-pay

## 2019-09-27 ENCOUNTER — Encounter (HOSPITAL_COMMUNITY): Payer: Self-pay

## 2019-09-27 ENCOUNTER — Emergency Department (HOSPITAL_COMMUNITY)
Admission: EM | Admit: 2019-09-27 | Discharge: 2019-09-28 | Disposition: A | Discharge status: Home / Self Care | Payer: Medicare PPO | Attending: Emergency Medicine | Admitting: Emergency Medicine

## 2019-09-27 DIAGNOSIS — D696 Thrombocytopenia, unspecified: Secondary | ICD-10-CM | POA: Diagnosis not present

## 2019-09-27 DIAGNOSIS — W19XXXA Unspecified fall, initial encounter: Secondary | ICD-10-CM | POA: Diagnosis not present

## 2019-09-27 DIAGNOSIS — W1839XA Other fall on same level, initial encounter: Secondary | ICD-10-CM | POA: Diagnosis not present

## 2019-09-27 DIAGNOSIS — Z87891 Personal history of nicotine dependence: Secondary | ICD-10-CM | POA: Diagnosis not present

## 2019-09-27 DIAGNOSIS — S0083XA Contusion of other part of head, initial encounter: Secondary | ICD-10-CM | POA: Diagnosis not present

## 2019-09-27 DIAGNOSIS — Z79899 Other long term (current) drug therapy: Secondary | ICD-10-CM | POA: Diagnosis not present

## 2019-09-27 DIAGNOSIS — Y999 Unspecified external cause status: Secondary | ICD-10-CM | POA: Diagnosis not present

## 2019-09-27 DIAGNOSIS — N183 Chronic kidney disease, stage 3 unspecified: Secondary | ICD-10-CM | POA: Insufficient documentation

## 2019-09-27 DIAGNOSIS — S42252D Displaced fracture of greater tuberosity of left humerus, subsequent encounter for fracture with routine healing: Secondary | ICD-10-CM | POA: Diagnosis not present

## 2019-09-27 DIAGNOSIS — S43015A Anterior dislocation of left humerus, initial encounter: Secondary | ICD-10-CM | POA: Diagnosis not present

## 2019-09-27 DIAGNOSIS — Z20822 Contact with and (suspected) exposure to covid-19: Secondary | ICD-10-CM | POA: Diagnosis not present

## 2019-09-27 DIAGNOSIS — R519 Headache, unspecified: Secondary | ICD-10-CM | POA: Insufficient documentation

## 2019-09-27 DIAGNOSIS — R000 Tachycardia, unspecified: Secondary | ICD-10-CM | POA: Diagnosis not present

## 2019-09-27 DIAGNOSIS — R52 Pain, unspecified: Secondary | ICD-10-CM | POA: Diagnosis not present

## 2019-09-27 DIAGNOSIS — S42252A Displaced fracture of greater tuberosity of left humerus, initial encounter for closed fracture: Secondary | ICD-10-CM | POA: Diagnosis not present

## 2019-09-27 DIAGNOSIS — Y92008 Other place in unspecified non-institutional (private) residence as the place of occurrence of the external cause: Secondary | ICD-10-CM | POA: Diagnosis not present

## 2019-09-27 DIAGNOSIS — Y939 Activity, unspecified: Secondary | ICD-10-CM | POA: Insufficient documentation

## 2019-09-27 DIAGNOSIS — I10 Essential (primary) hypertension: Secondary | ICD-10-CM | POA: Diagnosis not present

## 2019-09-27 DIAGNOSIS — I129 Hypertensive chronic kidney disease with stage 1 through stage 4 chronic kidney disease, or unspecified chronic kidney disease: Secondary | ICD-10-CM | POA: Diagnosis not present

## 2019-09-27 DIAGNOSIS — Z7982 Long term (current) use of aspirin: Secondary | ICD-10-CM | POA: Diagnosis not present

## 2019-09-27 DIAGNOSIS — S299XXA Unspecified injury of thorax, initial encounter: Secondary | ICD-10-CM | POA: Diagnosis not present

## 2019-09-27 DIAGNOSIS — S4992XA Unspecified injury of left shoulder and upper arm, initial encounter: Secondary | ICD-10-CM | POA: Diagnosis present

## 2019-09-27 DIAGNOSIS — S0003XA Contusion of scalp, initial encounter: Secondary | ICD-10-CM | POA: Diagnosis not present

## 2019-09-27 DIAGNOSIS — R0789 Other chest pain: Secondary | ICD-10-CM | POA: Diagnosis not present

## 2019-09-27 DIAGNOSIS — R69 Illness, unspecified: Secondary | ICD-10-CM | POA: Diagnosis not present

## 2019-09-27 LAB — URINALYSIS, ROUTINE W REFLEX MICROSCOPIC
Bacteria, UA: NONE SEEN
Bilirubin Urine: NEGATIVE
Glucose, UA: >=500 mg/dL — AB
Hgb urine dipstick: NEGATIVE
Ketones, ur: 5 mg/dL — AB
Leukocytes,Ua: NEGATIVE
Nitrite: NEGATIVE
Protein, ur: NEGATIVE mg/dL
Specific Gravity, Urine: 1.024 (ref 1.005–1.030)
pH: 5 (ref 5.0–8.0)

## 2019-09-27 LAB — BASIC METABOLIC PANEL
Anion gap: 6 (ref 5–15)
BUN: 24 mg/dL — ABNORMAL HIGH (ref 8–23)
CO2: 25 mmol/L (ref 22–32)
Calcium: 9.7 mg/dL (ref 8.9–10.3)
Chloride: 107 mmol/L (ref 98–111)
Creatinine, Ser: 1.6 mg/dL — ABNORMAL HIGH (ref 0.61–1.24)
GFR calc Af Amer: 47 mL/min — ABNORMAL LOW (ref >60)
GFR calc non Af Amer: 41 mL/min — ABNORMAL LOW (ref >60)
Glucose, Bld: 266 mg/dL — ABNORMAL HIGH (ref 70–99)
Potassium: 4.5 mmol/L (ref 3.5–5.1)
Sodium: 138 mmol/L (ref 135–145)

## 2019-09-27 LAB — POC SARS CORONAVIRUS 2 AG -  ED: SARS Coronavirus 2 Ag: NEGATIVE

## 2019-09-27 MED ORDER — OXYCODONE-ACETAMINOPHEN 5-325 MG PO TABS: 1.0000 | ORAL_TABLET | Freq: Four times a day (QID) | ORAL | 0 refills | Status: DC | PRN

## 2019-09-27 MED ORDER — OXYCODONE-ACETAMINOPHEN 5-325 MG PO TABS: 1.0000 | ORAL_TABLET | Freq: Four times a day (QID) | ORAL | 0 refills | Status: AC | PRN

## 2019-09-27 MED ORDER — PROPOFOL 10 MG/ML IV BOLUS
0.5000 mg/kg | Freq: Once | INTRAVENOUS | Status: AC
  Administered 2019-09-27: 20 mg via INTRAVENOUS

## 2019-09-27 MED ORDER — PROPOFOL 10 MG/ML IV BOLUS
1.0000 mg/kg | Freq: Once | INTRAVENOUS | Status: AC
  Administered 2019-09-27: 20 mg via INTRAVENOUS

## 2019-09-27 MED ORDER — PROPOFOL 10 MG/ML IV BOLUS
INTRAVENOUS | Status: AC
  Administered 2019-09-27: 100 mg via INTRAVENOUS
  Filled 2019-09-27: qty 60

## 2019-09-27 MED ORDER — HYDROMORPHONE HCL 1 MG/ML IJ SOLN
1.0000 mg | Freq: Once | INTRAMUSCULAR | Status: AC
  Administered 2019-09-27: 1 mg via INTRAVENOUS
  Filled 2019-09-27: qty 1

## 2019-09-27 NOTE — ED Triage Notes (Signed)
78 yo male BIB [facility] from home. Pt slipped and fell outside his home. Pt fell forward and hit his head. EMS reports it was difficult to get full report on scene because pt was screaming in pain. Pt was uncorporative. Pt did not lose consciousnes as per EMS. Pt refused C-collar on scene as per EMS. Pt is complaining of left shoulder pain. Pt is not on any blood thinners. Pt was given 200mg  of fentanyl on scene and now reports pain 4/10 as per EMS. Pts wife tested positive for COVID 2 days ago. Pt states he was not tested. AOx4 as per EMS  wifes contact information [phone] Wife said to call her daughter Octavio Manns first if anything . Daughters cell [phone]  Vitals: bp 184 palpated Hr 76 spo2 98% room air cbg 247 Temp 97.2 temporal 20g right forearm

## 2019-09-27 NOTE — ED Notes (Signed)
Patient has returned to baseline after sedation. No adverse effects noted upon RN assessment.

## 2019-09-27 NOTE — ED Notes (Signed)
Octavio Manns Troxler [phone]

## 2019-09-27 NOTE — ED Notes (Addendum)
Ignore 1843 timeout, error in charting. Correct one at [place]

## 2019-09-27 NOTE — Discharge Instructions (Addendum)
Please keep left arm in sling, nonweightbearing, follow-up with orthopedic surgery early next week for close recheck.  Call their number provided to schedule an appointment.  Return to ER if you develop any numbness, weakness or skin color changes in your left arm.  Additionally if you develop any chest pain or difficulty breathing, any episodes of passing out or further falls, return to ER.  Please follow up with primary doctor regarding the low platelets that were noted on his blood work today. He should have a CBC repeated within the next week or so.

## 2019-09-27 NOTE — ED Provider Notes (Signed)
[facility]-EMERGENCY DEPT Provider Note   CSN: [id number] Arrival date & time: 09/27/19  1547     History No chief complaint on file.   Wayne Phillips is a 78 y.o. male.  Presents to ER after fall.  Patient states fell, tripped on something while he was outside on his porch.  Does not recall exactly how he fell.  No syncope.  States landed on his head, left shoulder.  Has severe pain, was up to 10 out of 10, greatly improved after receiving fentanyl by EMS.  No associated numbness, weakness.  Denies any pain besides his left shoulder pain.  Has been ambulatory after the accident.  No pain in his lower legs.  Denies any anticoagulation, blood thinner.  No neck, back, chest or abdominal pain.  Of note, patient's wife tested positive for Covid 2 days ago, so yesterday he had a negative test.  Currently not having any symptoms.  HPI     Past Medical History:  Diagnosis Date  . Chronic kidney disease    Stage III  . CKD (chronic kidney disease) stage 3, GFR 30-59 ml/min   . Coronary artery disease   . Depression   . Diverticulosis    left sided  . DJD (degenerative joint disease)   . ED (erectile dysfunction)   . H/O pilonidal cyst   . High cholesterol   . HLD (hyperlipidemia)   . Hypertension   . Low back pain   . MI (myocardial infarction) (HCC)   . MI (myocardial infarction) (HCC) 2014  . Prostatitis   . Restless legs     Patient Active Problem List   Diagnosis Date Noted  . Pyrexia   . Pyuria 05/21/2015  . Sepsis (HCC) 05/21/2015    Past Surgical History:  Procedure Laterality Date  . CARDIAC CATHETERIZATION  07/2013   Circumflex 40%, LAD proximal 70%,RCA 50-60%,EF 55%  . COLONOSCOPY    . KNEE ARTHROSCOPY W/ ACL RECONSTRUCTION     left       No family history on file.  Social History   Tobacco Use  . Smoking status: Former Smoker    Types: Cigarettes  . Smokeless tobacco: Never Used  Substance Use Topics  . Alcohol use: No  .  Drug use: No    Home Medications Prior to Admission medications   Medication Sig Start Date End Date Taking? Authorizing Provider  acetaminophen (TYLENOL) 500 MG tablet Take 1,000 mg by mouth every 6 (six) hours as needed for moderate pain.   Yes [provider]  aspirin EC 81 MG tablet Take 81 mg by mouth daily.   Yes [provider]  citalopram (CELEXA) 20 MG tablet Take 20 mg by mouth daily.   Yes [provider]  clopidogrel (PLAVIX) 75 MG tablet Take 75 mg by mouth daily.   Yes [provider]  docusate sodium (COLACE) 100 MG capsule Take 100 mg by mouth at bedtime as needed for mild constipation.    Yes [provider]  donepezil (ARICEPT) 10 MG tablet Take 1/2 tablet daily for 2 weeks, then increase to 1 tablet daily 09/14/19  Yes Van Clines, MD  Evolocumab (REPATHA) 140 MG/ML SOSY Inject into the skin every 14 (fourteen) days.   Yes [provider]  fenofibrate (TRICOR) 48 MG tablet Take 48 mg by mouth daily. 09/12/19  Yes [provider]  finasteride (PROSCAR) 5 MG tablet Take 5 mg by mouth daily. 06/25/19  Yes [provider]  ibuprofen (ADVIL,MOTRIN) 200 MG tablet Take 400 mg by mouth every 6 (six) hours as needed. For pain   Yes [provider]  levothyroxine (SYNTHROID) 75 MCG tablet Take 75 mcg by mouth daily before breakfast.   Yes [provider]  lisinopril (ZESTRIL) 10 MG tablet Take 10 mg by mouth daily.   Yes [provider]  Melatonin 10 MG CAPS Take by mouth at bedtime.   Yes [provider]  metoprolol succinate (TOPROL-XL) 25 MG 24 hr tablet Take 12.5 mg by mouth daily.    Yes [provider]  oxybutynin (DITROPAN) 5 MG tablet Take 5 mg by mouth at bedtime. 07/01/19  Yes [provider]  oxyCODONE-acetaminophen (PERCOCET) 5-325 MG tablet Take 1 tablet by mouth every 6 (six) hours as needed for up to 3 days for severe pain. 09/27/19 09/30/19   Milagros Lollykstra, Carey Lafon S, MD    Allergies    Crestor [rosuvastatin], Novocain [procaine], Novocain [procaine], Pravachol [pravastatin sodium], Statins, Zetia [ezetimibe], Zocor [simvastatin], and Sulfamethoxazole-trimethoprim  Review of Systems   Review of Systems  Constitutional: Negative for chills and fever.  HENT: Negative for ear pain and sore throat.   Eyes: Negative for pain and visual disturbance.  Respiratory: Negative for cough and shortness of breath.   Cardiovascular: Negative for chest pain and palpitations.  Gastrointestinal: Negative for abdominal pain and vomiting.  Genitourinary: Negative for dysuria and hematuria.  Musculoskeletal: Positive for arthralgias. Negative for back pain.  Skin: Negative for color change and rash.  Neurological: Negative for seizures and syncope.  All other systems reviewed and are negative.   Physical Exam Updated Vital Signs BP (!) 188/85   Pulse 88   Temp 98.4 F (36.9 C) (Oral)   Resp (!) 29   SpO2 99%   Physical Exam Vitals and nursing note reviewed.  Constitutional:      Appearance: He is well-developed.  HENT:     Head: Normocephalic.     Comments: 3 cm raised hematoma to left forehead, no active bleeding, no laceration Eyes:     Conjunctiva/sclera: Conjunctivae normal.  Cardiovascular:     Rate and Rhythm: Normal rate and regular rhythm.     Heart sounds: No murmur.  Pulmonary:     Effort: Pulmonary effort is normal. No respiratory distress.     Breath sounds: Normal breath sounds.  Abdominal:     Palpations: Abdomen is soft.     Tenderness: There is no abdominal tenderness.  Musculoskeletal:     Cervical back: Neck supple.     Comments: BacK: No C-T-L spine TTP, no deformities  Extremities:  LUE: Obvious deformity to left shoulder, tenderness to palpation over left shoulder, range of motion limited by pain, sensation to light touch intact, distal motor intact, radial pulse intact RUE: No deformity, no tenderness  throughout extremity LLE: No deformity, no tenderness throughout extremity RLE: No deformity, no tenderness throughout extremity  Skin:    General: Skin is warm and dry.  Neurological:     General: No focal deficit present.     Mental Status: He is alert and oriented to person, place, and time.  Psychiatric:        Mood and Affect: Mood normal.        Behavior: Behavior normal.     ED Results / Procedures / Treatments   Labs (all labs ordered are listed, but only abnormal results are displayed) Labs Reviewed  URINALYSIS, ROUTINE W REFLEX MICROSCOPIC - Abnormal; Notable for the  following components:      Result Value   Glucose, UA >=500 (*)    Ketones, ur 5 (*)    All other components within normal limits  BASIC METABOLIC PANEL - Abnormal; Notable for the following components:   Glucose, Bld 266 (*)    BUN 24 (*)    Creatinine, Ser 1.60 (*)    GFR calc non Af Amer 41 (*)    GFR calc Af Amer 47 (*)    All other components within normal limits  CBC WITH DIFFERENTIAL/PLATELET  CBC WITH DIFFERENTIAL/PLATELET  POC SARS CORONAVIRUS 2 AG -  ED    EKG None  Radiology CT Head Wo Contrast  Result Date: 09/27/2019 CLINICAL DATA:  Headache after fall EXAM: CT HEAD WITHOUT CONTRAST TECHNIQUE: Contiguous axial images were obtained from the base of the skull through the vertex without intravenous contrast. COMPARISON:  05/21/2015, 05/28/2018 FINDINGS: Brain: No evidence of acute infarction, hemorrhage, hydrocephalus, extra-axial collection or mass lesion/mass effect. Scattered low-density changes within the periventricular and subcortical white matter compatible with chronic microvascular ischemic change. Mild diffuse cerebral volume loss. Vascular: No hyperdense vessel or unexpected calcification. Skull: Negative for calvarial fracture. Sinuses/Orbits: No acute finding. Other: Superficial scalp hematoma overlying the left frontal region. IMPRESSION: 1. No acute intracranial abnormality. 2.  Left frontal scalp hematoma.  No underlying calvarial fracture. 3. Chronic microvascular ischemic changes and cerebral volume loss. Electronically Signed   By: Duanne Guess D.O.   On: 09/27/2019 19:20   DG Chest Portable 1 View  Result Date: 09/27/2019 CLINICAL DATA:  78 year old male with history of trauma from a fall complaining of left shoulder and chest wall pain. EXAM: PORTABLE CHEST 1 VIEW COMPARISON:  Chest x-ray 05/21/2015. FINDINGS: Lung volumes are low. No consolidative airspace disease. No pleural effusions. No pneumothorax. No pulmonary nodule or mass noted. Pulmonary vasculature and the cardiomediastinal silhouette are within normal limits. Multiple old healed left-sided rib fractures are incidentally noted. Left-sided shoulder fracture/dislocation, poorly evaluated on today's examination, but this appears to reflect a in isolated fracture of the greater tuberosity of the humerus, which may be comminuted. IMPRESSION: 1. Low lung volumes without radiographic evidence of acute cardiopulmonary disease. 2. Fracture/dislocation of the left shoulder, as above. Correlation with forthcoming dedicated shoulder radiographs is recommended. Electronically Signed   By: Trudie Reed M.D.   On: 09/27/2019 17:00   DG Shoulder Left Portable  Result Date: 09/27/2019 CLINICAL DATA:  Status post reduction. EXAM: LEFT SHOULDER COMPARISON:  Same day. FINDINGS: There appears to have been successful reduction of the anterior left shoulder dislocation noted on prior exam. There remains fracture involving the greater tuberosity of the proximal left humerus. Old left rib fractures are noted. IMPRESSION: Successful reduction of anterior left shoulder dislocation. Persistent fracture involving greater tuberosity of proximal left humerus is noted. Electronically Signed   By: Lupita Raider M.D.   On: 09/27/2019 19:39   DG Shoulder Left Portable  Result Date: 09/27/2019 CLINICAL DATA:  Status post reduction of  left shoulder. EXAM: LEFT SHOULDER COMPARISON:  Same day. FINDINGS: There is continued anterior dislocation of the proximal left humerus. Moderately displaced fracture involving the greater tuberosity of proximal left humerus is again noted. IMPRESSION: Continued anterior dislocation of the proximal left humerus. Moderately displaced fracture of greater tuberosity is again noted. Electronically Signed   By: Lupita Raider M.D.   On: 09/27/2019 18:51   DG Shoulder Left Portable  Result Date: 09/27/2019 CLINICAL DATA:  Left shoulder dislocation. EXAM:  LEFT SHOULDER COMPARISON:  Same day. FINDINGS: There is continued anterior dislocation of the proximal left humeral head, with moderately displaced fracture of the greater tuberosity region. IMPRESSION: Continued anterior dislocation of proximal left humerus with moderately displaced fracture of the greater tuberosity region. Electronically Signed   By: Lupita Raider M.D.   On: 09/27/2019 18:00   DG Shoulder Left Portable  Result Date: 09/27/2019 CLINICAL DATA:  Fall, left shoulder pain EXAM: LEFT SHOULDER COMPARISON:  None. FINDINGS: There is anterior dislocation of the left shoulder. Fracture noted through the humeral head in the greater tuberosity region. AC joint is intact. IMPRESSION: Left proximal humeral fracture and anterior dislocation Electronically Signed   By: Charlett Nose M.D.   On: 09/27/2019 16:59    Procedures .Sedation  Date/Time: 09/27/2019 10:21 PM Performed by: Milagros Loll, MD Authorized by: Milagros Loll, MD   Consent:    Consent obtained:  Verbal   Consent given by:  Patient   Risks discussed:  Allergic reaction, dysrhythmia, inadequate sedation, nausea, prolonged hypoxia resulting in organ damage, prolonged sedation necessitating reversal, respiratory compromise necessitating ventilatory assistance and intubation and vomiting   Alternatives discussed:  Analgesia without sedation, anxiolysis and regional  anesthesia Universal protocol:    Procedure explained and questions answered to patient or proxy's satisfaction: yes     Relevant documents present and verified: yes     Test results available and properly labeled: yes     Imaging studies available: yes     Required blood products, implants, devices, and special equipment available: yes     Site/side marked: yes     Immediately prior to procedure a time out was called: yes     Patient identity confirmation method:  Verbally with patient Indications:    Procedure necessitating sedation performed by:  Physician performing sedation Pre-sedation assessment:    Time since last food or drink:  This morning   ASA classification: class 2 - patient with mild systemic disease     Neck mobility: normal     Mouth opening:  3 or more finger widths   Thyromental distance:  4 finger widths   Mallampati score:  I - soft palate, uvula, fauces, pillars visible   Pre-sedation assessments completed and reviewed: airway patency, cardiovascular function, hydration status, mental status, nausea/vomiting, pain level, respiratory function and temperature   Immediate pre-procedure details:    Reassessment: Patient reassessed immediately prior to procedure     Reviewed: vital signs, relevant labs/tests and NPO status     Verified: bag valve mask available, emergency equipment available, intubation equipment available, IV patency confirmed, oxygen available and suction available   Procedure details (see MAR for exact dosages):    Preoxygenation:  Nasal cannula   Sedation:  Propofol   Intended level of sedation: moderate (conscious sedation)   Intra-procedure monitoring:  Blood pressure monitoring, cardiac monitor, continuous pulse oximetry, frequent LOC assessments, frequent vital sign checks and continuous capnometry   Intra-procedure events: none     Total Provider sedation time (minutes):  30 Post-procedure details:    Attendance: Constant attendance by  certified staff until patient recovered     Recovery: Patient returned to pre-procedure baseline     Post-sedation assessments completed and reviewed: airway patency, cardiovascular function, hydration status, mental status, nausea/vomiting, pain level, respiratory function and temperature     Patient is stable for discharge or admission: yes     Patient tolerance:  Tolerated well, no immediate complications Reduction of dislocation  Date/Time:  09/27/2019 10:22 PM Performed by: Lucrezia Starch, MD Authorized by: Lucrezia Starch, MD  Consent: Verbal consent obtained. Written consent obtained. Risks and benefits: risks, benefits and alternatives were discussed Consent given by: patient Patient identity confirmed: verbally with patient and arm band Time out: Immediately prior to procedure a "time out" was called to verify the correct patient, procedure, equipment, support staff and site/side marked as required. Local anesthesia used: no  Anesthesia: Local anesthesia used: no  Sedation: Patient sedated: yes  Comments: Under procedural sedation, applied distal, downward traction to patient's arm, then externally rotated and abducted arm, felt joint reduced. Sophia PA assisted.    (including critical care time)  Medications Ordered in ED Medications  HYDROmorphone (DILAUDID) injection 1 mg (1 mg Intravenous Given 09/27/19 1632)  propofol (DIPRIVAN) 10 mg/mL bolus/IV push 110.2 mg (20 mg Intravenous Given 09/27/19 1820)  propofol (DIPRIVAN) 10 mg/mL bolus/IV push 55.1 mg (20 mg Intravenous Given 09/27/19 1821)  propofol (DIPRIVAN) 10 mg/mL bolus/IV push 55.1 mg (20 mg Intravenous Given 09/27/19 1821)  propofol (DIPRIVAN) 10 mg/mL bolus/IV push 55.1 mg (20 mg Intravenous Given 09/27/19 1822)  propofol (DIPRIVAN) 10 mg/mL bolus/IV push 55.1 mg (20 mg Intravenous Given 09/27/19 1824)    ED Course  I have reviewed the triage vital signs and the nursing notes.  Pertinent labs &  imaging results that were available during my care of the patient were reviewed by me and considered in my medical decision making (see chart for details).  Clinical Course as of Sep 26 2328  Thu Sep 27, 2019  1736 Attempted shoulder reduction without sedation, patient didn't tolerate attempt well, will set up for sedation   [RD]  1900 Successful shoulder dislocation reduction using procedural sedation   [RD]  2021 Discussed with daughter, need for Ortho follow-up next week, she is in [place], able to come to [place] this evening to be with patient and wife, she is a Art therapist, concerned patient may have UTI causing fall, will check UA and basic labs   [RD]    Clinical Course User Index [RD] Lucrezia Starch, MD   MDM Rules/Calculators/A&P                      78 year old male past medical history mild dementia but very independent still, CKD, ROS, hyperlipidemia presents to ER after fall.  On exam noted to have left shoulder deformity.  Left forehead hematoma, no other trauma identified.  Vital signs stable.  Used procedural sedation with propofol, performed close reduction of shoulder dislocation.  Neurovascularly intact.  Discussed with Ninfa Linden on call for ortho, recommends sling, NWB and f/u next week in his clinic.  CT head neg. UA neg. Reviewed with daughter who is NP from out of state, she is able to come in to town to help at home.    After the discussed management above, the patient was determined to be safe for discharge.  The patient was in agreement with this plan and all questions regarding their care were answered.  ED return precautions were discussed and the patient will return to the ED with any significant worsening of condition.     Final Clinical Impression(s) / ED Diagnoses Final diagnoses:  Anterior dislocation of left shoulder, initial encounter  Traumatic hematoma of forehead, initial encounter    Rx / DC Orders ED Discharge  Orders         Ordered    oxyCODONE-acetaminophen (PERCOCET) 5-325 MG tablet  Every 6 hours PRN,   Status:  Discontinued     09/27/19 1958    oxyCODONE-acetaminophen (PERCOCET) 5-325 MG tablet  Every 6 hours PRN     09/27/19 2123           Milagros Loll, MD 09/27/19 2329

## 2019-09-27 NOTE — ED Notes (Signed)
[facility] contacted for transport and paperwork printed   

## 2019-09-28 DIAGNOSIS — I10 Essential (primary) hypertension: Secondary | ICD-10-CM | POA: Diagnosis not present

## 2019-09-28 DIAGNOSIS — Z743 Need for continuous supervision: Secondary | ICD-10-CM | POA: Diagnosis not present

## 2019-09-28 DIAGNOSIS — R279 Unspecified lack of coordination: Secondary | ICD-10-CM | POA: Diagnosis not present

## 2019-09-28 LAB — CBC WITH DIFFERENTIAL/PLATELET
Abs Immature Granulocytes: 0.05 10*3/uL (ref 0.00–0.07)
Basophils Absolute: 0 10*3/uL (ref 0.0–0.1)
Basophils Relative: 0 %
Eosinophils Absolute: 0 10*3/uL (ref 0.0–0.5)
Eosinophils Relative: 0 %
HCT: 43.9 % (ref 39.0–52.0)
Hemoglobin: 14.5 g/dL (ref 13.0–17.0)
Immature Granulocytes: 1 %
Lymphocytes Relative: 6 %
Lymphs Abs: 0.6 10*3/uL — ABNORMAL LOW (ref 0.7–4.0)
MCH: 31.7 pg (ref 26.0–34.0)
MCHC: 33 g/dL (ref 30.0–36.0)
MCV: 96.1 fL (ref 80.0–100.0)
Monocytes Absolute: 0.6 10*3/uL (ref 0.1–1.0)
Monocytes Relative: 6 %
Neutro Abs: 9 10*3/uL — ABNORMAL HIGH (ref 1.7–7.7)
Neutrophils Relative %: 87 %
Platelets: 34 10*3/uL — ABNORMAL LOW (ref 150–400)
RBC: 4.57 MIL/uL (ref 4.22–5.81)
RDW: 12.6 % (ref 11.5–15.5)
WBC: 10.2 10*3/uL (ref 4.0–10.5)
nRBC: 0 % (ref 0.0–0.2)

## 2019-09-28 MED ORDER — OXYCODONE-ACETAMINOPHEN 5-325 MG PO TABS
1.0000 | ORAL_TABLET | ORAL | Status: DC | PRN
  Administered 2019-09-28: 1 via ORAL
  Filled 2019-09-28: qty 1

## 2019-10-01 DIAGNOSIS — M25512 Pain in left shoulder: Secondary | ICD-10-CM | POA: Diagnosis not present

## 2019-10-04 DIAGNOSIS — S46012A Strain of muscle(s) and tendon(s) of the rotator cuff of left shoulder, initial encounter: Secondary | ICD-10-CM | POA: Diagnosis not present

## 2019-10-04 DIAGNOSIS — M25512 Pain in left shoulder: Secondary | ICD-10-CM | POA: Diagnosis not present

## 2019-10-04 DIAGNOSIS — S42252A Displaced fracture of greater tuberosity of left humerus, initial encounter for closed fracture: Secondary | ICD-10-CM | POA: Diagnosis not present

## 2019-10-04 DIAGNOSIS — M25412 Effusion, left shoulder: Secondary | ICD-10-CM | POA: Diagnosis not present

## 2019-10-05 DIAGNOSIS — N401 Enlarged prostate with lower urinary tract symptoms: Secondary | ICD-10-CM | POA: Diagnosis not present

## 2019-10-05 DIAGNOSIS — F0390 Unspecified dementia without behavioral disturbance: Secondary | ICD-10-CM | POA: Diagnosis not present

## 2019-10-05 DIAGNOSIS — S4292XA Fracture of left shoulder girdle, part unspecified, initial encounter for closed fracture: Secondary | ICD-10-CM | POA: Diagnosis not present

## 2019-10-05 DIAGNOSIS — R269 Unspecified abnormalities of gait and mobility: Secondary | ICD-10-CM | POA: Diagnosis not present

## 2019-10-09 ENCOUNTER — Telehealth: Payer: Self-pay | Admitting: Neurology

## 2019-10-09 MED ORDER — QUETIAPINE FUMARATE 25 MG PO TABS: ORAL_TABLET | ORAL | 4 refills | Status: DC

## 2019-10-09 NOTE — Telephone Encounter (Signed)
Spoke to daughter Francesco Runner about recent events. He had 2 falls within 3 days, second fall had shoulder dislocation. When he was in [facility], he was extremely confused, he grabbed at her to shove away twice, which is very unusual. Only gives oxycodone when he c/o pain. She thinks pain is well controlled on Tramadol every 4-6 hrs. While in [facility] he was throwing things, snapping at grandkids. He is in [place] now with his other daughter and has not been agitated, relatively calm. Discussed that the increased confusion is likely combination of pain, but also pain medication. Ok to continue Tramadol, we discussed managing behaviors with redirection, we can try prn Seroquel for the significant agitation. Side effects, including cardiac black box warning discussed. Francesco Runner understood risks and benefits and agrees to start low dose prn Seroquel. All questions answered, she will keep [place] updated.

## 2019-10-09 NOTE — Telephone Encounter (Signed)
Patient's daughter called to report an increase in the patient's periods of agitation. He also had a fall on New Years Day. She'd like a call back from a clinical staff member.  [facility] on [address]

## 2019-10-09 NOTE — Telephone Encounter (Signed)
Pt fell on Dec 31st. Since then pt has been agitated. He has grabbed daughter by the arms and pushed her out of the way. She does not know if it is from the pain/ trauma from the fall. ? Progression of dementia?  Urine was negative for infection at hospital.  Oxycodone was given at the hospital. Daughter is trying not to give to pt because she believes it makes his agitation worse.  Pt is in [place] with his other daughter currently.  Doing ok there,less agitation. Periods of confusion.  Wants to know what to do next? Wants to make sure he is safe. ? Having a virtual visit.  Pt dislocated shoulder during fall on Dec 31st.  Had MRI in [facility]. Decided not to have surgery.  No PT yet due to fall.  D/c oxybutynin, finasteride while in hospital.

## 2019-10-16 DIAGNOSIS — M25512 Pain in left shoulder: Secondary | ICD-10-CM | POA: Diagnosis not present

## 2019-10-22 DIAGNOSIS — S4292XD Fracture of left shoulder girdle, part unspecified, subsequent encounter for fracture with routine healing: Secondary | ICD-10-CM | POA: Diagnosis not present

## 2019-10-22 DIAGNOSIS — R413 Other amnesia: Secondary | ICD-10-CM | POA: Diagnosis not present

## 2019-10-22 DIAGNOSIS — N401 Enlarged prostate with lower urinary tract symptoms: Secondary | ICD-10-CM | POA: Diagnosis not present

## 2019-10-26 ENCOUNTER — Telehealth: Payer: Self-pay | Admitting: Neurology

## 2019-10-26 DIAGNOSIS — F0151 Vascular dementia with behavioral disturbance: Secondary | ICD-10-CM | POA: Diagnosis not present

## 2019-10-26 DIAGNOSIS — E782 Mixed hyperlipidemia: Secondary | ICD-10-CM | POA: Diagnosis not present

## 2019-10-26 DIAGNOSIS — I25119 Atherosclerotic heart disease of native coronary artery with unspecified angina pectoris: Secondary | ICD-10-CM | POA: Diagnosis not present

## 2019-10-26 DIAGNOSIS — N183 Chronic kidney disease, stage 3 unspecified: Secondary | ICD-10-CM | POA: Diagnosis not present

## 2019-10-26 DIAGNOSIS — F331 Major depressive disorder, recurrent, moderate: Secondary | ICD-10-CM | POA: Diagnosis not present

## 2019-10-26 DIAGNOSIS — I252 Old myocardial infarction: Secondary | ICD-10-CM | POA: Diagnosis not present

## 2019-10-26 DIAGNOSIS — E038 Other specified hypothyroidism: Secondary | ICD-10-CM | POA: Diagnosis not present

## 2019-10-26 DIAGNOSIS — F5101 Primary insomnia: Secondary | ICD-10-CM | POA: Diagnosis not present

## 2019-10-26 DIAGNOSIS — E1122 Type 2 diabetes mellitus with diabetic chronic kidney disease: Secondary | ICD-10-CM | POA: Diagnosis not present

## 2019-10-26 NOTE — Telephone Encounter (Signed)
Spoke with pt daughter she is worried about her father, had a fall 12/31 had shoulder dislocation was given oxy started having mood change and psych change, medication was changed to tramadol and was better, pt is now in [place] with his other daughter and his wife still having confusion, at times not eating, at times when he talks they do not know what he is talking about, he has had a VV with his PCP they started him on Seroquel at night he has taken it twice so far scheduled, they have checked his urine twice it was normal. PT is scheduled for surgery on 2/10 daughter is unsure if he should have it. Asking if he may be able to do a VV from [place] with Dr.Aquino?

## 2019-10-26 NOTE — Telephone Encounter (Signed)
Pt will do VV on Feb 2nd at 9:30

## 2019-10-26 NOTE — Telephone Encounter (Signed)
Patient's daughter called to give an update to clinical staff since he started Seroquel. He is having a lot of episodes of confusion which she'll explain more about.

## 2019-10-26 NOTE — Telephone Encounter (Signed)
Number for VV [phone]

## 2019-10-26 NOTE — Telephone Encounter (Signed)
Can do a virtual visit on Tues Feb 2 at either 9am or 9:30am, thanks

## 2019-10-29 DIAGNOSIS — F0151 Vascular dementia with behavioral disturbance: Secondary | ICD-10-CM | POA: Diagnosis not present

## 2019-10-29 DIAGNOSIS — F5101 Primary insomnia: Secondary | ICD-10-CM | POA: Diagnosis not present

## 2019-10-30 ENCOUNTER — Telehealth (INDEPENDENT_AMBULATORY_CARE_PROVIDER_SITE_OTHER): Payer: Medicare PPO | Admitting: Neurology

## 2019-10-30 ENCOUNTER — Other Ambulatory Visit: Payer: Self-pay

## 2019-10-30 ENCOUNTER — Encounter: Payer: Self-pay | Admitting: Neurology

## 2019-10-30 DIAGNOSIS — R4182 Altered mental status, unspecified: Secondary | ICD-10-CM | POA: Diagnosis not present

## 2019-10-30 DIAGNOSIS — I1310 Hypertensive heart and chronic kidney disease without heart failure, with stage 1 through stage 4 chronic kidney disease, or unspecified chronic kidney disease: Secondary | ICD-10-CM | POA: Diagnosis not present

## 2019-10-30 DIAGNOSIS — Z1152 Encounter for screening for COVID-19: Secondary | ICD-10-CM | POA: Diagnosis not present

## 2019-10-30 DIAGNOSIS — N179 Acute kidney failure, unspecified: Secondary | ICD-10-CM | POA: Diagnosis not present

## 2019-10-30 DIAGNOSIS — R404 Transient alteration of awareness: Secondary | ICD-10-CM | POA: Diagnosis not present

## 2019-10-30 DIAGNOSIS — M25519 Pain in unspecified shoulder: Secondary | ICD-10-CM | POA: Diagnosis not present

## 2019-10-30 DIAGNOSIS — I2510 Atherosclerotic heart disease of native coronary artery without angina pectoris: Secondary | ICD-10-CM | POA: Diagnosis not present

## 2019-10-30 DIAGNOSIS — F0390 Unspecified dementia without behavioral disturbance: Secondary | ICD-10-CM | POA: Diagnosis not present

## 2019-10-30 DIAGNOSIS — I4891 Unspecified atrial fibrillation: Secondary | ICD-10-CM | POA: Diagnosis not present

## 2019-10-30 DIAGNOSIS — N1831 Chronic kidney disease, stage 3a: Secondary | ICD-10-CM | POA: Diagnosis not present

## 2019-10-30 DIAGNOSIS — R4689 Other symptoms and signs involving appearance and behavior: Secondary | ICD-10-CM | POA: Diagnosis not present

## 2019-10-30 DIAGNOSIS — E782 Mixed hyperlipidemia: Secondary | ICD-10-CM | POA: Diagnosis not present

## 2019-10-30 DIAGNOSIS — I252 Old myocardial infarction: Secondary | ICD-10-CM | POA: Diagnosis not present

## 2019-10-30 DIAGNOSIS — F03A0 Unspecified dementia, mild, without behavioral disturbance, psychotic disturbance, mood disturbance, and anxiety: Secondary | ICD-10-CM

## 2019-10-30 DIAGNOSIS — F0391 Unspecified dementia with behavioral disturbance: Secondary | ICD-10-CM | POA: Diagnosis not present

## 2019-10-30 DIAGNOSIS — E039 Hypothyroidism, unspecified: Secondary | ICD-10-CM | POA: Diagnosis not present

## 2019-10-30 DIAGNOSIS — R456 Violent behavior: Secondary | ICD-10-CM | POA: Diagnosis not present

## 2019-10-30 NOTE — Progress Notes (Signed)
Telephone (Audio) Visit The purpose of this telephone visit is to provide medical care while limiting exposure to the novel coronavirus.    Consent was obtained for telephone visit and initiated by pt/family:  Yes.   Answered questions that patient had about telehealth interaction:  Yes.   I discussed the limitations, risks, security and privacy concerns of performing an evaluation and management service by telephone. I also discussed with the patient that there may be a patient responsible charge related to this service. The patient expressed understanding and agreed to proceed.  Pt location: Home Physician Location: office Name of referring provider:  Kirby Funk, MD I connected with .Kalob Bergen Cappello at patients daughter's initiation/request on 10/30/2019 at  9:30 AM EST by telephone and verified that I am speaking with the correct person using two identifiers.  Pt MRN:  [id number] Pt DOB:  1941/06/05   History of Present Illness:  The patient had a telephone visit on 10/30/2019. He is lethargic and difficult to arouse per daughter Tobi Bastos, information is obtained from daughter. 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 Since his last visit, his other daughter Francesco Runner had contacted our office regarding 2 falls in December, the fall on 12/31 caused left shoulder dislocation. He had a head CT on 12/31 with no acute changes, there was a left frontal scalp hematoma noted with no underlying calvarial fracture. He was brought to stay with Francesco Runner in [facility] then moved to Anna's house in [place] on 1/11. While at [facility], he was extremely confused, acting very different, grabbing at his daughter and shoving her away twice. He was throwing things and snapping at his grandchildren. He had prn oxycodone and Tramadol every 4-6 hours. He was initially more calm at Anna's house, however he was drowsy during the day, refusing to get up or do any basic  hygiene. It was hard to make him eat. When he talks, they are not sure what he is saying sometimes. Symptoms significantly worsened the past 6 days, Tobi Bastos reports a lot of screaming, pushing, they had to stop him from walking out the door 2 nights ago. She felt he is a risk to himself and to her small children in the house. Dose of Seroquel was increased to 25mg  in AM, 50mg  in PM. He was asleep most of the day, he woke up for dinner and went to bed at 10pm, woke up briefly at 6am this morning to drink milk and eat some cookies, and has been asleep for the past 3 hours. reports that he may be weaker on the left side, but it is hard to say because when he is angry and agitated, his legs and right arm are strong. He goes from being violent/pushing to barely being able to walk or lift a fork to his mouth. Yesterday he kept pulling the cord out of the computer repeatedly when they tried to get him watch news from . She is not sure if he is in pain from his shoulder, he has surgery scheduled on 2/10. Sometimes he does not move his arm, but other times they see him moving it. He is given Tramadol 3 times a day, and Tylenol once a day in between. Yesterday he was not combative or paranoid.    History on Initial Assessment 09/14/2019: This is a pleasant 79 year old right-handed man with a history of hypertension, hyperlipidemia, CKD, RLS, presenting for evaluation of memory loss. He  was recently evaluated at the [facility] 3 months ago with a MOCA score of 18/30. They would like to establish care locally. He feels his memory is okay. His daughter started noticing memory changes over the past 10-12 months. He lives with his wife. On his visit at [facility], his wife reported a 2-3 year history of progressive memory loss. His wife reported forgetting dates and getting situations mixed up. She would be talking about things that happened and he would not understand what she is talking about. 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 His daughter reports they would have to repeat things for him to comprehend. He asks the same questions repeatedly. He denies getting lost driving, however Octavio Manns reports he got lost driving to [place] to visit her. In the past 6 months, he had gotten confused a couple of times getting home in [place]. He would call Octavio Manns asking her why he is talking a medication or if he can stop it, so his wife took over medications 2 years ago. He does not remember this. He manages finances, another daughter in [place] checks behind him and had only noted one time where he paid a bill twice. He does not cook and denies leaving the stove or faucet on. He states he is pretty organized and denies misplacing things. He denies any word-finding difficulties. He has a history of frequent UTIs over the past 4 years where he becomes very confused and tends to fall, one time he had hallucinations of people in the house. He denies any falls, Octavio Manns reminds him he fell a couple of months ago. Octavio Manns reports that he retired all of a sudden 1.5 years ago, which was a big change for him since he has always been busy. He states his mood is "sometimes tired." Octavio Manns reports he has been sleeping more. His demeanor is more quiet now. He takes naps all day long. He states his sleep is good, but he has told [place] he does not sleep well at night. For a time he was on Ambien long-term, switched to melatonin because he was sedated during the daytime and more confused. His wife thinks he sleeps well but tosses and turns a lot. He has needed more naps since January. No paranoia. His maternal grandmother had dementia. No history of significant head injuries or alcohol use.  He denies any headaches, dizziness, diplopia, dysarthria, dysphagia, neck/back pain, focal numbness/tingling/weakness, bowel  dysfunction. No anosmia, tremors. He has urinary frequency. He states he is walking a bit more, but is "not doing anything at home."   He had an MRI brain without contrast Memory protocol done 07/2019 which showed generalized atrophy with mild temporoparietal predominance.  Laboratory Data: Vitamin B12 in 05/2019 was 699  Outpatient Encounter Medications as of 10/30/2019  Medication Sig  . QUEtiapine (SEROQUEL) 25 MG tablet Take 25 mg by mouth 2 (two) times daily. Take 1 tab in AM, 2 tabs in PM  . traMADol (ULTRAM) 50 MG tablet 50 mg.  . acetaminophen (TYLENOL) 500 MG tablet Take 1,000 mg by mouth every 6 (six) hours as needed for moderate pain.  Marland Kitchen aspirin EC 81 MG tablet Take 81 mg by mouth daily.  . citalopram (CELEXA) 20 MG tablet Take 20 mg by mouth daily.  . clopidogrel (PLAVIX) 75 MG tablet Take 75 mg by mouth daily. Stop on  3rd 7 days before surg.  . docusate sodium (COLACE) 100 MG capsule Take 100 mg by mouth at bedtime as needed for mild constipation.   Marland Kitchen donepezil (ARICEPT) 10 MG tablet Take 1/2 tablet daily for 2 weeks, then increase to 1 tablet daily  . Evolocumab (REPATHA) 140 MG/ML SOSY Inject into the skin every 14 (fourteen) days.  . fenofibrate (TRICOR) 48 MG tablet Take 48 mg by mouth daily.  . finasteride (PROSCAR) 5 MG tablet Take 5 mg by mouth daily.  Marland Kitchen ibuprofen (ADVIL,MOTRIN) 200 MG tablet Take 400 mg by mouth every 6 (six) hours as needed. For pain  . levothyroxine (SYNTHROID) 75 MCG tablet Take 75 mcg by mouth daily before breakfast.  . lisinopril (ZESTRIL) 10 MG tablet Take 10 mg by mouth daily.  . Melatonin 10 MG CAPS Take by mouth at bedtime.  . metoprolol succinate (TOPROL-XL) 25 MG 24 hr tablet Take 25 mg by mouth daily.   Marland Kitchen oxybutynin (DITROPAN) 5 MG tablet Take 5 mg by mouth at bedtime.  .    .     No facility-administered encounter medications on file as of 10/30/2019.     Observations/Objective:  Limited due to nature of phone visit. Patient is asleep,  lethargic per daughter. History obtained from daughter over phone.   Assessment and Plan:   This is a pleasant 79 yo RH man with a history of hypertension, hyperlipidemia, CKD, RLS, who was seen in the neurology clinic 6 weeks ago for mild dementia, SLUMS score 15/30. MRI brain in 07/2019 showed generalized atrophy with mild temporoparietal predominance. He has had a significant change in mental status in the past 6 weeks. With acute changes such as these, I discussed with his daughter that several factors may be causing his symptoms, including pain, pain medication, but also would rule out underlying structural abnormality with repeat brain imaging, infectious workup. They have been doing home urinalysis kits which have been negative. Advised that with change in mental status and lethargy, he needs evaluation in the ER. He is in [place] currently. Follow-up as scheduled, his family knows to call for any changes.   Follow Up Instructions:   -I discussed the assessment and treatment plan with the patient's daughter. The patient's daughter was provided an opportunity to ask questions and all were answered. The patient's daughter agreed with the plan and demonstrated an understanding of the instructions.   The patient's daughter was advised to call back or seek an in-person evaluation if the symptoms worsen or if the condition fails to improve as anticipated.    Total Time spent in visit with the patient was:  14 minutes, of which 100% of the time was spent in counseling and/or coordinating care on the above.   Pt's daughter understands and agrees with the plan of care outlined.     Van Clines, MD

## 2019-10-31 DIAGNOSIS — I4891 Unspecified atrial fibrillation: Secondary | ICD-10-CM | POA: Diagnosis not present

## 2019-10-31 DIAGNOSIS — I491 Atrial premature depolarization: Secondary | ICD-10-CM | POA: Diagnosis not present

## 2019-10-31 DIAGNOSIS — R011 Cardiac murmur, unspecified: Secondary | ICD-10-CM | POA: Diagnosis not present

## 2019-10-31 DIAGNOSIS — R4182 Altered mental status, unspecified: Secondary | ICD-10-CM | POA: Diagnosis not present

## 2019-10-31 DIAGNOSIS — F0151 Vascular dementia with behavioral disturbance: Secondary | ICD-10-CM | POA: Diagnosis not present

## 2019-10-31 DIAGNOSIS — I2510 Atherosclerotic heart disease of native coronary artery without angina pectoris: Secondary | ICD-10-CM | POA: Diagnosis not present

## 2019-10-31 DIAGNOSIS — I119 Hypertensive heart disease without heart failure: Secondary | ICD-10-CM | POA: Diagnosis not present

## 2019-11-01 DIAGNOSIS — I639 Cerebral infarction, unspecified: Secondary | ICD-10-CM | POA: Diagnosis not present

## 2019-11-01 DIAGNOSIS — F0151 Vascular dementia with behavioral disturbance: Secondary | ICD-10-CM | POA: Diagnosis not present

## 2019-11-01 DIAGNOSIS — R4182 Altered mental status, unspecified: Secondary | ICD-10-CM | POA: Diagnosis not present

## 2019-11-01 DIAGNOSIS — I491 Atrial premature depolarization: Secondary | ICD-10-CM | POA: Diagnosis not present

## 2019-11-01 DIAGNOSIS — M25512 Pain in left shoulder: Secondary | ICD-10-CM | POA: Diagnosis not present

## 2019-11-01 DIAGNOSIS — I119 Hypertensive heart disease without heart failure: Secondary | ICD-10-CM | POA: Diagnosis not present

## 2019-11-01 DIAGNOSIS — I2510 Atherosclerotic heart disease of native coronary artery without angina pectoris: Secondary | ICD-10-CM | POA: Diagnosis not present

## 2019-11-01 DIAGNOSIS — E1122 Type 2 diabetes mellitus with diabetic chronic kidney disease: Secondary | ICD-10-CM | POA: Diagnosis not present

## 2019-11-01 DIAGNOSIS — N189 Chronic kidney disease, unspecified: Secondary | ICD-10-CM | POA: Diagnosis not present

## 2019-11-01 DIAGNOSIS — E782 Mixed hyperlipidemia: Secondary | ICD-10-CM | POA: Diagnosis not present

## 2019-11-02 DIAGNOSIS — I2510 Atherosclerotic heart disease of native coronary artery without angina pectoris: Secondary | ICD-10-CM | POA: Diagnosis not present

## 2019-11-02 DIAGNOSIS — N189 Chronic kidney disease, unspecified: Secondary | ICD-10-CM | POA: Diagnosis not present

## 2019-11-02 DIAGNOSIS — Z0189 Encounter for other specified special examinations: Secondary | ICD-10-CM | POA: Diagnosis not present

## 2019-11-02 DIAGNOSIS — E782 Mixed hyperlipidemia: Secondary | ICD-10-CM | POA: Diagnosis not present

## 2019-11-02 DIAGNOSIS — F0151 Vascular dementia with behavioral disturbance: Secondary | ICD-10-CM | POA: Diagnosis not present

## 2019-11-02 DIAGNOSIS — I119 Hypertensive heart disease without heart failure: Secondary | ICD-10-CM | POA: Diagnosis not present

## 2019-11-02 DIAGNOSIS — I491 Atrial premature depolarization: Secondary | ICD-10-CM | POA: Diagnosis not present

## 2019-11-02 DIAGNOSIS — N1831 Chronic kidney disease, stage 3a: Secondary | ICD-10-CM | POA: Diagnosis not present

## 2019-11-02 DIAGNOSIS — R4182 Altered mental status, unspecified: Secondary | ICD-10-CM | POA: Diagnosis not present

## 2019-11-02 DIAGNOSIS — F0391 Unspecified dementia with behavioral disturbance: Secondary | ICD-10-CM | POA: Diagnosis not present

## 2019-11-03 DIAGNOSIS — I2510 Atherosclerotic heart disease of native coronary artery without angina pectoris: Secondary | ICD-10-CM | POA: Diagnosis not present

## 2019-11-03 DIAGNOSIS — F0391 Unspecified dementia with behavioral disturbance: Secondary | ICD-10-CM | POA: Diagnosis not present

## 2019-11-03 DIAGNOSIS — E782 Mixed hyperlipidemia: Secondary | ICD-10-CM | POA: Diagnosis not present

## 2019-11-03 DIAGNOSIS — I491 Atrial premature depolarization: Secondary | ICD-10-CM | POA: Diagnosis not present

## 2019-11-03 DIAGNOSIS — I119 Hypertensive heart disease without heart failure: Secondary | ICD-10-CM | POA: Diagnosis not present

## 2019-11-04 DIAGNOSIS — F0391 Unspecified dementia with behavioral disturbance: Secondary | ICD-10-CM | POA: Diagnosis not present

## 2019-11-05 DIAGNOSIS — F0391 Unspecified dementia with behavioral disturbance: Secondary | ICD-10-CM | POA: Diagnosis not present

## 2019-11-06 DIAGNOSIS — E119 Type 2 diabetes mellitus without complications: Secondary | ICD-10-CM | POA: Diagnosis not present

## 2019-11-06 DIAGNOSIS — F0391 Unspecified dementia with behavioral disturbance: Secondary | ICD-10-CM | POA: Diagnosis not present

## 2019-11-07 DIAGNOSIS — F0390 Unspecified dementia without behavioral disturbance: Secondary | ICD-10-CM | POA: Diagnosis not present

## 2019-11-07 DIAGNOSIS — E039 Hypothyroidism, unspecified: Secondary | ICD-10-CM | POA: Diagnosis not present

## 2019-11-07 DIAGNOSIS — R7303 Prediabetes: Secondary | ICD-10-CM | POA: Diagnosis not present

## 2019-11-07 DIAGNOSIS — I119 Hypertensive heart disease without heart failure: Secondary | ICD-10-CM | POA: Diagnosis not present

## 2019-11-07 DIAGNOSIS — R4182 Altered mental status, unspecified: Secondary | ICD-10-CM | POA: Diagnosis not present

## 2019-11-07 DIAGNOSIS — N179 Acute kidney failure, unspecified: Secondary | ICD-10-CM | POA: Diagnosis not present

## 2019-11-07 DIAGNOSIS — I2510 Atherosclerotic heart disease of native coronary artery without angina pectoris: Secondary | ICD-10-CM | POA: Diagnosis not present

## 2019-11-07 DIAGNOSIS — N189 Chronic kidney disease, unspecified: Secondary | ICD-10-CM | POA: Diagnosis not present

## 2019-11-08 DIAGNOSIS — I2510 Atherosclerotic heart disease of native coronary artery without angina pectoris: Secondary | ICD-10-CM | POA: Diagnosis not present

## 2019-11-08 DIAGNOSIS — N179 Acute kidney failure, unspecified: Secondary | ICD-10-CM | POA: Diagnosis not present

## 2019-11-08 DIAGNOSIS — E039 Hypothyroidism, unspecified: Secondary | ICD-10-CM | POA: Diagnosis not present

## 2019-11-08 DIAGNOSIS — N189 Chronic kidney disease, unspecified: Secondary | ICD-10-CM | POA: Diagnosis not present

## 2019-11-08 DIAGNOSIS — R4182 Altered mental status, unspecified: Secondary | ICD-10-CM | POA: Diagnosis not present

## 2019-11-08 DIAGNOSIS — F0390 Unspecified dementia without behavioral disturbance: Secondary | ICD-10-CM | POA: Diagnosis not present

## 2019-11-08 DIAGNOSIS — I119 Hypertensive heart disease without heart failure: Secondary | ICD-10-CM | POA: Diagnosis not present

## 2019-11-08 DIAGNOSIS — R7303 Prediabetes: Secondary | ICD-10-CM | POA: Diagnosis not present

## 2019-11-09 DIAGNOSIS — F0390 Unspecified dementia without behavioral disturbance: Secondary | ICD-10-CM | POA: Diagnosis not present

## 2019-11-09 DIAGNOSIS — N189 Chronic kidney disease, unspecified: Secondary | ICD-10-CM | POA: Diagnosis not present

## 2019-11-10 DIAGNOSIS — M6281 Muscle weakness (generalized): Secondary | ICD-10-CM | POA: Diagnosis not present

## 2019-11-10 DIAGNOSIS — R4182 Altered mental status, unspecified: Secondary | ICD-10-CM | POA: Diagnosis not present

## 2019-11-13 DIAGNOSIS — F411 Generalized anxiety disorder: Secondary | ICD-10-CM | POA: Diagnosis not present

## 2019-11-13 DIAGNOSIS — E039 Hypothyroidism, unspecified: Secondary | ICD-10-CM | POA: Diagnosis not present

## 2019-11-13 DIAGNOSIS — G8929 Other chronic pain: Secondary | ICD-10-CM | POA: Diagnosis not present

## 2019-11-13 DIAGNOSIS — R2681 Unsteadiness on feet: Secondary | ICD-10-CM | POA: Diagnosis not present

## 2019-11-13 DIAGNOSIS — I10 Essential (primary) hypertension: Secondary | ICD-10-CM | POA: Diagnosis not present

## 2019-11-13 DIAGNOSIS — F329 Major depressive disorder, single episode, unspecified: Secondary | ICD-10-CM | POA: Diagnosis not present

## 2019-11-13 DIAGNOSIS — G4700 Insomnia, unspecified: Secondary | ICD-10-CM | POA: Diagnosis not present

## 2019-11-13 DIAGNOSIS — F0391 Unspecified dementia with behavioral disturbance: Secondary | ICD-10-CM | POA: Diagnosis not present

## 2019-11-14 DIAGNOSIS — Z9183 Wandering in diseases classified elsewhere: Secondary | ICD-10-CM | POA: Diagnosis not present

## 2019-11-14 DIAGNOSIS — F0281 Dementia in other diseases classified elsewhere with behavioral disturbance: Secondary | ICD-10-CM | POA: Diagnosis not present

## 2019-11-14 DIAGNOSIS — Z9181 History of falling: Secondary | ICD-10-CM | POA: Diagnosis not present

## 2019-11-14 DIAGNOSIS — F430 Acute stress reaction: Secondary | ICD-10-CM | POA: Diagnosis not present

## 2019-11-14 DIAGNOSIS — R4689 Other symptoms and signs involving appearance and behavior: Secondary | ICD-10-CM | POA: Diagnosis not present

## 2019-11-14 DIAGNOSIS — Z79899 Other long term (current) drug therapy: Secondary | ICD-10-CM | POA: Diagnosis not present

## 2019-11-14 DIAGNOSIS — F4325 Adjustment disorder with mixed disturbance of emotions and conduct: Secondary | ICD-10-CM | POA: Diagnosis not present

## 2019-11-15 DIAGNOSIS — R4182 Altered mental status, unspecified: Secondary | ICD-10-CM | POA: Diagnosis not present

## 2019-11-16 DIAGNOSIS — F419 Anxiety disorder, unspecified: Secondary | ICD-10-CM | POA: Diagnosis not present

## 2019-11-16 DIAGNOSIS — S42202D Unspecified fracture of upper end of left humerus, subsequent encounter for fracture with routine healing: Secondary | ICD-10-CM | POA: Diagnosis not present

## 2019-11-16 DIAGNOSIS — F0391 Unspecified dementia with behavioral disturbance: Secondary | ICD-10-CM | POA: Diagnosis not present

## 2019-11-16 DIAGNOSIS — I2510 Atherosclerotic heart disease of native coronary artery without angina pectoris: Secondary | ICD-10-CM | POA: Diagnosis not present

## 2019-11-16 DIAGNOSIS — E785 Hyperlipidemia, unspecified: Secondary | ICD-10-CM | POA: Diagnosis not present

## 2019-11-16 DIAGNOSIS — I10 Essential (primary) hypertension: Secondary | ICD-10-CM | POA: Diagnosis not present

## 2019-11-16 DIAGNOSIS — F329 Major depressive disorder, single episode, unspecified: Secondary | ICD-10-CM | POA: Diagnosis not present

## 2019-11-16 DIAGNOSIS — E119 Type 2 diabetes mellitus without complications: Secondary | ICD-10-CM | POA: Diagnosis not present

## 2019-11-16 DIAGNOSIS — E039 Hypothyroidism, unspecified: Secondary | ICD-10-CM | POA: Diagnosis not present

## 2019-11-19 DIAGNOSIS — I2510 Atherosclerotic heart disease of native coronary artery without angina pectoris: Secondary | ICD-10-CM | POA: Diagnosis not present

## 2019-11-19 DIAGNOSIS — S42202D Unspecified fracture of upper end of left humerus, subsequent encounter for fracture with routine healing: Secondary | ICD-10-CM | POA: Diagnosis not present

## 2019-11-19 DIAGNOSIS — E039 Hypothyroidism, unspecified: Secondary | ICD-10-CM | POA: Diagnosis not present

## 2019-11-19 DIAGNOSIS — F0391 Unspecified dementia with behavioral disturbance: Secondary | ICD-10-CM | POA: Diagnosis not present

## 2019-11-19 DIAGNOSIS — F419 Anxiety disorder, unspecified: Secondary | ICD-10-CM | POA: Diagnosis not present

## 2019-11-19 DIAGNOSIS — E119 Type 2 diabetes mellitus without complications: Secondary | ICD-10-CM | POA: Diagnosis not present

## 2019-11-19 DIAGNOSIS — F329 Major depressive disorder, single episode, unspecified: Secondary | ICD-10-CM | POA: Diagnosis not present

## 2019-11-19 DIAGNOSIS — E785 Hyperlipidemia, unspecified: Secondary | ICD-10-CM | POA: Diagnosis not present

## 2019-11-19 DIAGNOSIS — I10 Essential (primary) hypertension: Secondary | ICD-10-CM | POA: Diagnosis not present

## 2019-11-20 DIAGNOSIS — N390 Urinary tract infection, site not specified: Secondary | ICD-10-CM | POA: Diagnosis not present

## 2019-11-20 DIAGNOSIS — F329 Major depressive disorder, single episode, unspecified: Secondary | ICD-10-CM | POA: Diagnosis not present

## 2019-11-20 DIAGNOSIS — F419 Anxiety disorder, unspecified: Secondary | ICD-10-CM | POA: Diagnosis not present

## 2019-11-20 DIAGNOSIS — I10 Essential (primary) hypertension: Secondary | ICD-10-CM | POA: Diagnosis not present

## 2019-11-20 DIAGNOSIS — E039 Hypothyroidism, unspecified: Secondary | ICD-10-CM | POA: Diagnosis not present

## 2019-11-20 DIAGNOSIS — E119 Type 2 diabetes mellitus without complications: Secondary | ICD-10-CM | POA: Diagnosis not present

## 2019-11-20 DIAGNOSIS — S42202D Unspecified fracture of upper end of left humerus, subsequent encounter for fracture with routine healing: Secondary | ICD-10-CM | POA: Diagnosis not present

## 2019-11-20 DIAGNOSIS — E785 Hyperlipidemia, unspecified: Secondary | ICD-10-CM | POA: Diagnosis not present

## 2019-11-20 DIAGNOSIS — F0391 Unspecified dementia with behavioral disturbance: Secondary | ICD-10-CM | POA: Diagnosis not present

## 2019-11-20 DIAGNOSIS — I2510 Atherosclerotic heart disease of native coronary artery without angina pectoris: Secondary | ICD-10-CM | POA: Diagnosis not present

## 2019-11-21 DIAGNOSIS — F430 Acute stress reaction: Secondary | ICD-10-CM | POA: Diagnosis not present

## 2019-11-21 DIAGNOSIS — R4689 Other symptoms and signs involving appearance and behavior: Secondary | ICD-10-CM | POA: Diagnosis not present

## 2019-11-21 DIAGNOSIS — Z9183 Wandering in diseases classified elsewhere: Secondary | ICD-10-CM | POA: Diagnosis not present

## 2019-11-21 DIAGNOSIS — F0281 Dementia in other diseases classified elsewhere with behavioral disturbance: Secondary | ICD-10-CM | POA: Diagnosis not present

## 2019-11-21 DIAGNOSIS — Z9181 History of falling: Secondary | ICD-10-CM | POA: Diagnosis not present

## 2019-11-21 DIAGNOSIS — F4325 Adjustment disorder with mixed disturbance of emotions and conduct: Secondary | ICD-10-CM | POA: Diagnosis not present

## 2019-11-21 DIAGNOSIS — Z79899 Other long term (current) drug therapy: Secondary | ICD-10-CM | POA: Diagnosis not present

## 2019-11-22 DIAGNOSIS — F419 Anxiety disorder, unspecified: Secondary | ICD-10-CM | POA: Diagnosis not present

## 2019-11-22 DIAGNOSIS — F329 Major depressive disorder, single episode, unspecified: Secondary | ICD-10-CM | POA: Diagnosis not present

## 2019-11-22 DIAGNOSIS — E119 Type 2 diabetes mellitus without complications: Secondary | ICD-10-CM | POA: Diagnosis not present

## 2019-11-22 DIAGNOSIS — E039 Hypothyroidism, unspecified: Secondary | ICD-10-CM | POA: Diagnosis not present

## 2019-11-22 DIAGNOSIS — I2510 Atherosclerotic heart disease of native coronary artery without angina pectoris: Secondary | ICD-10-CM | POA: Diagnosis not present

## 2019-11-22 DIAGNOSIS — E785 Hyperlipidemia, unspecified: Secondary | ICD-10-CM | POA: Diagnosis not present

## 2019-11-22 DIAGNOSIS — I10 Essential (primary) hypertension: Secondary | ICD-10-CM | POA: Diagnosis not present

## 2019-11-22 DIAGNOSIS — F0391 Unspecified dementia with behavioral disturbance: Secondary | ICD-10-CM | POA: Diagnosis not present

## 2019-11-22 DIAGNOSIS — S42202D Unspecified fracture of upper end of left humerus, subsequent encounter for fracture with routine healing: Secondary | ICD-10-CM | POA: Diagnosis not present

## 2019-11-28 DIAGNOSIS — Z9183 Wandering in diseases classified elsewhere: Secondary | ICD-10-CM | POA: Diagnosis not present

## 2019-11-28 DIAGNOSIS — E785 Hyperlipidemia, unspecified: Secondary | ICD-10-CM | POA: Diagnosis not present

## 2019-11-28 DIAGNOSIS — I10 Essential (primary) hypertension: Secondary | ICD-10-CM | POA: Diagnosis not present

## 2019-11-28 DIAGNOSIS — F0391 Unspecified dementia with behavioral disturbance: Secondary | ICD-10-CM | POA: Diagnosis not present

## 2019-11-28 DIAGNOSIS — F4325 Adjustment disorder with mixed disturbance of emotions and conduct: Secondary | ICD-10-CM | POA: Diagnosis not present

## 2019-11-28 DIAGNOSIS — F0281 Dementia in other diseases classified elsewhere with behavioral disturbance: Secondary | ICD-10-CM | POA: Diagnosis not present

## 2019-11-28 DIAGNOSIS — R4689 Other symptoms and signs involving appearance and behavior: Secondary | ICD-10-CM | POA: Diagnosis not present

## 2019-11-28 DIAGNOSIS — Z79899 Other long term (current) drug therapy: Secondary | ICD-10-CM | POA: Diagnosis not present

## 2019-11-28 DIAGNOSIS — E119 Type 2 diabetes mellitus without complications: Secondary | ICD-10-CM | POA: Diagnosis not present

## 2019-11-28 DIAGNOSIS — I2510 Atherosclerotic heart disease of native coronary artery without angina pectoris: Secondary | ICD-10-CM | POA: Diagnosis not present

## 2019-11-28 DIAGNOSIS — F329 Major depressive disorder, single episode, unspecified: Secondary | ICD-10-CM | POA: Diagnosis not present

## 2019-11-28 DIAGNOSIS — F430 Acute stress reaction: Secondary | ICD-10-CM | POA: Diagnosis not present

## 2019-11-28 DIAGNOSIS — Z9181 History of falling: Secondary | ICD-10-CM | POA: Diagnosis not present

## 2019-11-28 DIAGNOSIS — S42202D Unspecified fracture of upper end of left humerus, subsequent encounter for fracture with routine healing: Secondary | ICD-10-CM | POA: Diagnosis not present

## 2019-11-28 DIAGNOSIS — E039 Hypothyroidism, unspecified: Secondary | ICD-10-CM | POA: Diagnosis not present

## 2019-11-28 DIAGNOSIS — F419 Anxiety disorder, unspecified: Secondary | ICD-10-CM | POA: Diagnosis not present

## 2019-11-30 DIAGNOSIS — F419 Anxiety disorder, unspecified: Secondary | ICD-10-CM | POA: Diagnosis not present

## 2019-11-30 DIAGNOSIS — I2510 Atherosclerotic heart disease of native coronary artery without angina pectoris: Secondary | ICD-10-CM | POA: Diagnosis not present

## 2019-11-30 DIAGNOSIS — S42202D Unspecified fracture of upper end of left humerus, subsequent encounter for fracture with routine healing: Secondary | ICD-10-CM | POA: Diagnosis not present

## 2019-11-30 DIAGNOSIS — I10 Essential (primary) hypertension: Secondary | ICD-10-CM | POA: Diagnosis not present

## 2019-11-30 DIAGNOSIS — F329 Major depressive disorder, single episode, unspecified: Secondary | ICD-10-CM | POA: Diagnosis not present

## 2019-11-30 DIAGNOSIS — F0391 Unspecified dementia with behavioral disturbance: Secondary | ICD-10-CM | POA: Diagnosis not present

## 2019-11-30 DIAGNOSIS — E039 Hypothyroidism, unspecified: Secondary | ICD-10-CM | POA: Diagnosis not present

## 2019-11-30 DIAGNOSIS — E785 Hyperlipidemia, unspecified: Secondary | ICD-10-CM | POA: Diagnosis not present

## 2019-11-30 DIAGNOSIS — E119 Type 2 diabetes mellitus without complications: Secondary | ICD-10-CM | POA: Diagnosis not present

## 2019-12-04 DIAGNOSIS — I2510 Atherosclerotic heart disease of native coronary artery without angina pectoris: Secondary | ICD-10-CM | POA: Diagnosis not present

## 2019-12-04 DIAGNOSIS — E039 Hypothyroidism, unspecified: Secondary | ICD-10-CM | POA: Diagnosis not present

## 2019-12-04 DIAGNOSIS — S42202D Unspecified fracture of upper end of left humerus, subsequent encounter for fracture with routine healing: Secondary | ICD-10-CM | POA: Diagnosis not present

## 2019-12-04 DIAGNOSIS — E785 Hyperlipidemia, unspecified: Secondary | ICD-10-CM | POA: Diagnosis not present

## 2019-12-04 DIAGNOSIS — R296 Repeated falls: Secondary | ICD-10-CM | POA: Diagnosis not present

## 2019-12-04 DIAGNOSIS — R410 Disorientation, unspecified: Secondary | ICD-10-CM | POA: Diagnosis not present

## 2019-12-04 DIAGNOSIS — S50812A Abrasion of left forearm, initial encounter: Secondary | ICD-10-CM | POA: Diagnosis not present

## 2019-12-04 DIAGNOSIS — F0391 Unspecified dementia with behavioral disturbance: Secondary | ICD-10-CM | POA: Diagnosis not present

## 2019-12-04 DIAGNOSIS — I10 Essential (primary) hypertension: Secondary | ICD-10-CM | POA: Diagnosis not present

## 2019-12-04 DIAGNOSIS — E119 Type 2 diabetes mellitus without complications: Secondary | ICD-10-CM | POA: Diagnosis not present

## 2019-12-04 DIAGNOSIS — F419 Anxiety disorder, unspecified: Secondary | ICD-10-CM | POA: Diagnosis not present

## 2019-12-04 DIAGNOSIS — F329 Major depressive disorder, single episode, unspecified: Secondary | ICD-10-CM | POA: Diagnosis not present

## 2019-12-05 DIAGNOSIS — I2510 Atherosclerotic heart disease of native coronary artery without angina pectoris: Secondary | ICD-10-CM | POA: Diagnosis not present

## 2019-12-05 DIAGNOSIS — Z9181 History of falling: Secondary | ICD-10-CM | POA: Diagnosis not present

## 2019-12-05 DIAGNOSIS — F0391 Unspecified dementia with behavioral disturbance: Secondary | ICD-10-CM | POA: Diagnosis not present

## 2019-12-05 DIAGNOSIS — I10 Essential (primary) hypertension: Secondary | ICD-10-CM | POA: Diagnosis not present

## 2019-12-05 DIAGNOSIS — E785 Hyperlipidemia, unspecified: Secondary | ICD-10-CM | POA: Diagnosis not present

## 2019-12-05 DIAGNOSIS — E039 Hypothyroidism, unspecified: Secondary | ICD-10-CM | POA: Diagnosis not present

## 2019-12-05 DIAGNOSIS — F4325 Adjustment disorder with mixed disturbance of emotions and conduct: Secondary | ICD-10-CM | POA: Diagnosis not present

## 2019-12-05 DIAGNOSIS — F430 Acute stress reaction: Secondary | ICD-10-CM | POA: Diagnosis not present

## 2019-12-05 DIAGNOSIS — R4689 Other symptoms and signs involving appearance and behavior: Secondary | ICD-10-CM | POA: Diagnosis not present

## 2019-12-05 DIAGNOSIS — E119 Type 2 diabetes mellitus without complications: Secondary | ICD-10-CM | POA: Diagnosis not present

## 2019-12-05 DIAGNOSIS — F0281 Dementia in other diseases classified elsewhere with behavioral disturbance: Secondary | ICD-10-CM | POA: Diagnosis not present

## 2019-12-05 DIAGNOSIS — F419 Anxiety disorder, unspecified: Secondary | ICD-10-CM | POA: Diagnosis not present

## 2019-12-05 DIAGNOSIS — F329 Major depressive disorder, single episode, unspecified: Secondary | ICD-10-CM | POA: Diagnosis not present

## 2019-12-05 DIAGNOSIS — Z79899 Other long term (current) drug therapy: Secondary | ICD-10-CM | POA: Diagnosis not present

## 2019-12-05 DIAGNOSIS — Z9183 Wandering in diseases classified elsewhere: Secondary | ICD-10-CM | POA: Diagnosis not present

## 2019-12-05 DIAGNOSIS — S42202D Unspecified fracture of upper end of left humerus, subsequent encounter for fracture with routine healing: Secondary | ICD-10-CM | POA: Diagnosis not present

## 2019-12-06 DIAGNOSIS — S43002D Unspecified subluxation of left shoulder joint, subsequent encounter: Secondary | ICD-10-CM | POA: Diagnosis not present

## 2019-12-06 DIAGNOSIS — M25512 Pain in left shoulder: Secondary | ICD-10-CM | POA: Diagnosis not present

## 2019-12-11 DIAGNOSIS — I2510 Atherosclerotic heart disease of native coronary artery without angina pectoris: Secondary | ICD-10-CM | POA: Diagnosis not present

## 2019-12-11 DIAGNOSIS — E785 Hyperlipidemia, unspecified: Secondary | ICD-10-CM | POA: Diagnosis not present

## 2019-12-11 DIAGNOSIS — S42202D Unspecified fracture of upper end of left humerus, subsequent encounter for fracture with routine healing: Secondary | ICD-10-CM | POA: Diagnosis not present

## 2019-12-11 DIAGNOSIS — N390 Urinary tract infection, site not specified: Secondary | ICD-10-CM | POA: Diagnosis not present

## 2019-12-11 DIAGNOSIS — F0391 Unspecified dementia with behavioral disturbance: Secondary | ICD-10-CM | POA: Diagnosis not present

## 2019-12-11 DIAGNOSIS — F419 Anxiety disorder, unspecified: Secondary | ICD-10-CM | POA: Diagnosis not present

## 2019-12-11 DIAGNOSIS — I10 Essential (primary) hypertension: Secondary | ICD-10-CM | POA: Diagnosis not present

## 2019-12-11 DIAGNOSIS — F329 Major depressive disorder, single episode, unspecified: Secondary | ICD-10-CM | POA: Diagnosis not present

## 2019-12-11 DIAGNOSIS — E119 Type 2 diabetes mellitus without complications: Secondary | ICD-10-CM | POA: Diagnosis not present

## 2019-12-11 DIAGNOSIS — E039 Hypothyroidism, unspecified: Secondary | ICD-10-CM | POA: Diagnosis not present

## 2019-12-12 DIAGNOSIS — Z79899 Other long term (current) drug therapy: Secondary | ICD-10-CM | POA: Diagnosis not present

## 2019-12-12 DIAGNOSIS — F0281 Dementia in other diseases classified elsewhere with behavioral disturbance: Secondary | ICD-10-CM | POA: Diagnosis not present

## 2019-12-12 DIAGNOSIS — Z9183 Wandering in diseases classified elsewhere: Secondary | ICD-10-CM | POA: Diagnosis not present

## 2019-12-12 DIAGNOSIS — R4689 Other symptoms and signs involving appearance and behavior: Secondary | ICD-10-CM | POA: Diagnosis not present

## 2019-12-12 DIAGNOSIS — F430 Acute stress reaction: Secondary | ICD-10-CM | POA: Diagnosis not present

## 2019-12-12 DIAGNOSIS — T43595A Adverse effect of other antipsychotics and neuroleptics, initial encounter: Secondary | ICD-10-CM | POA: Diagnosis not present

## 2019-12-12 DIAGNOSIS — Z9181 History of falling: Secondary | ICD-10-CM | POA: Diagnosis not present

## 2019-12-12 DIAGNOSIS — F4325 Adjustment disorder with mixed disturbance of emotions and conduct: Secondary | ICD-10-CM | POA: Diagnosis not present

## 2019-12-14 DIAGNOSIS — I10 Essential (primary) hypertension: Secondary | ICD-10-CM | POA: Diagnosis not present

## 2019-12-14 DIAGNOSIS — E119 Type 2 diabetes mellitus without complications: Secondary | ICD-10-CM | POA: Diagnosis not present

## 2019-12-14 DIAGNOSIS — F419 Anxiety disorder, unspecified: Secondary | ICD-10-CM | POA: Diagnosis not present

## 2019-12-14 DIAGNOSIS — E039 Hypothyroidism, unspecified: Secondary | ICD-10-CM | POA: Diagnosis not present

## 2019-12-14 DIAGNOSIS — M25512 Pain in left shoulder: Secondary | ICD-10-CM | POA: Diagnosis not present

## 2019-12-14 DIAGNOSIS — E785 Hyperlipidemia, unspecified: Secondary | ICD-10-CM | POA: Diagnosis not present

## 2019-12-14 DIAGNOSIS — G4700 Insomnia, unspecified: Secondary | ICD-10-CM | POA: Diagnosis not present

## 2019-12-14 DIAGNOSIS — F329 Major depressive disorder, single episode, unspecified: Secondary | ICD-10-CM | POA: Diagnosis not present

## 2019-12-15 DIAGNOSIS — E039 Hypothyroidism, unspecified: Secondary | ICD-10-CM | POA: Diagnosis not present

## 2019-12-15 DIAGNOSIS — G4700 Insomnia, unspecified: Secondary | ICD-10-CM | POA: Diagnosis not present

## 2019-12-15 DIAGNOSIS — I10 Essential (primary) hypertension: Secondary | ICD-10-CM | POA: Diagnosis not present

## 2019-12-16 DIAGNOSIS — I2510 Atherosclerotic heart disease of native coronary artery without angina pectoris: Secondary | ICD-10-CM | POA: Diagnosis not present

## 2019-12-16 DIAGNOSIS — F0391 Unspecified dementia with behavioral disturbance: Secondary | ICD-10-CM | POA: Diagnosis not present

## 2019-12-16 DIAGNOSIS — S42202D Unspecified fracture of upper end of left humerus, subsequent encounter for fracture with routine healing: Secondary | ICD-10-CM | POA: Diagnosis not present

## 2019-12-16 DIAGNOSIS — F329 Major depressive disorder, single episode, unspecified: Secondary | ICD-10-CM | POA: Diagnosis not present

## 2019-12-16 DIAGNOSIS — F419 Anxiety disorder, unspecified: Secondary | ICD-10-CM | POA: Diagnosis not present

## 2019-12-16 DIAGNOSIS — E785 Hyperlipidemia, unspecified: Secondary | ICD-10-CM | POA: Diagnosis not present

## 2019-12-16 DIAGNOSIS — I10 Essential (primary) hypertension: Secondary | ICD-10-CM | POA: Diagnosis not present

## 2019-12-16 DIAGNOSIS — E039 Hypothyroidism, unspecified: Secondary | ICD-10-CM | POA: Diagnosis not present

## 2019-12-16 DIAGNOSIS — E119 Type 2 diabetes mellitus without complications: Secondary | ICD-10-CM | POA: Diagnosis not present

## 2019-12-17 DIAGNOSIS — F419 Anxiety disorder, unspecified: Secondary | ICD-10-CM | POA: Diagnosis not present

## 2019-12-17 DIAGNOSIS — E039 Hypothyroidism, unspecified: Secondary | ICD-10-CM | POA: Diagnosis not present

## 2019-12-17 DIAGNOSIS — F329 Major depressive disorder, single episode, unspecified: Secondary | ICD-10-CM | POA: Diagnosis not present

## 2019-12-17 DIAGNOSIS — E119 Type 2 diabetes mellitus without complications: Secondary | ICD-10-CM | POA: Diagnosis not present

## 2019-12-17 DIAGNOSIS — I2510 Atherosclerotic heart disease of native coronary artery without angina pectoris: Secondary | ICD-10-CM | POA: Diagnosis not present

## 2019-12-17 DIAGNOSIS — F0391 Unspecified dementia with behavioral disturbance: Secondary | ICD-10-CM | POA: Diagnosis not present

## 2019-12-17 DIAGNOSIS — S42202D Unspecified fracture of upper end of left humerus, subsequent encounter for fracture with routine healing: Secondary | ICD-10-CM | POA: Diagnosis not present

## 2019-12-17 DIAGNOSIS — E785 Hyperlipidemia, unspecified: Secondary | ICD-10-CM | POA: Diagnosis not present

## 2019-12-17 DIAGNOSIS — I10 Essential (primary) hypertension: Secondary | ICD-10-CM | POA: Diagnosis not present

## 2019-12-19 DIAGNOSIS — E119 Type 2 diabetes mellitus without complications: Secondary | ICD-10-CM | POA: Diagnosis not present

## 2019-12-19 DIAGNOSIS — S42202D Unspecified fracture of upper end of left humerus, subsequent encounter for fracture with routine healing: Secondary | ICD-10-CM | POA: Diagnosis not present

## 2019-12-19 DIAGNOSIS — F4325 Adjustment disorder with mixed disturbance of emotions and conduct: Secondary | ICD-10-CM | POA: Diagnosis not present

## 2019-12-19 DIAGNOSIS — I10 Essential (primary) hypertension: Secondary | ICD-10-CM | POA: Diagnosis not present

## 2019-12-19 DIAGNOSIS — F329 Major depressive disorder, single episode, unspecified: Secondary | ICD-10-CM | POA: Diagnosis not present

## 2019-12-19 DIAGNOSIS — F430 Acute stress reaction: Secondary | ICD-10-CM | POA: Diagnosis not present

## 2019-12-19 DIAGNOSIS — F419 Anxiety disorder, unspecified: Secondary | ICD-10-CM | POA: Diagnosis not present

## 2019-12-19 DIAGNOSIS — Z9183 Wandering in diseases classified elsewhere: Secondary | ICD-10-CM | POA: Diagnosis not present

## 2019-12-19 DIAGNOSIS — E785 Hyperlipidemia, unspecified: Secondary | ICD-10-CM | POA: Diagnosis not present

## 2019-12-19 DIAGNOSIS — Z9181 History of falling: Secondary | ICD-10-CM | POA: Diagnosis not present

## 2019-12-19 DIAGNOSIS — F0281 Dementia in other diseases classified elsewhere with behavioral disturbance: Secondary | ICD-10-CM | POA: Diagnosis not present

## 2019-12-19 DIAGNOSIS — I2510 Atherosclerotic heart disease of native coronary artery without angina pectoris: Secondary | ICD-10-CM | POA: Diagnosis not present

## 2019-12-19 DIAGNOSIS — T43595A Adverse effect of other antipsychotics and neuroleptics, initial encounter: Secondary | ICD-10-CM | POA: Diagnosis not present

## 2019-12-19 DIAGNOSIS — R4689 Other symptoms and signs involving appearance and behavior: Secondary | ICD-10-CM | POA: Diagnosis not present

## 2019-12-19 DIAGNOSIS — E039 Hypothyroidism, unspecified: Secondary | ICD-10-CM | POA: Diagnosis not present

## 2019-12-19 DIAGNOSIS — Z79899 Other long term (current) drug therapy: Secondary | ICD-10-CM | POA: Diagnosis not present

## 2019-12-19 DIAGNOSIS — F0391 Unspecified dementia with behavioral disturbance: Secondary | ICD-10-CM | POA: Diagnosis not present

## 2019-12-20 DIAGNOSIS — R2681 Unsteadiness on feet: Secondary | ICD-10-CM | POA: Diagnosis not present

## 2019-12-20 DIAGNOSIS — R296 Repeated falls: Secondary | ICD-10-CM | POA: Diagnosis not present

## 2019-12-21 DIAGNOSIS — S42212D Unspecified displaced fracture of surgical neck of left humerus, subsequent encounter for fracture with routine healing: Secondary | ICD-10-CM | POA: Diagnosis not present

## 2019-12-21 DIAGNOSIS — R82998 Other abnormal findings in urine: Secondary | ICD-10-CM | POA: Diagnosis not present

## 2019-12-21 DIAGNOSIS — F419 Anxiety disorder, unspecified: Secondary | ICD-10-CM | POA: Diagnosis not present

## 2019-12-21 DIAGNOSIS — Z9181 History of falling: Secondary | ICD-10-CM | POA: Diagnosis not present

## 2019-12-21 DIAGNOSIS — R531 Weakness: Secondary | ICD-10-CM | POA: Diagnosis not present

## 2019-12-21 DIAGNOSIS — I10 Essential (primary) hypertension: Secondary | ICD-10-CM | POA: Diagnosis not present

## 2019-12-21 DIAGNOSIS — R8290 Unspecified abnormal findings in urine: Secondary | ICD-10-CM | POA: Diagnosis not present

## 2019-12-21 DIAGNOSIS — F0391 Unspecified dementia with behavioral disturbance: Secondary | ICD-10-CM | POA: Diagnosis not present

## 2019-12-21 DIAGNOSIS — F329 Major depressive disorder, single episode, unspecified: Secondary | ICD-10-CM | POA: Diagnosis not present

## 2019-12-21 DIAGNOSIS — Z87440 Personal history of urinary (tract) infections: Secondary | ICD-10-CM | POA: Diagnosis not present

## 2019-12-21 DIAGNOSIS — M25512 Pain in left shoulder: Secondary | ICD-10-CM | POA: Diagnosis not present

## 2019-12-25 DIAGNOSIS — F0391 Unspecified dementia with behavioral disturbance: Secondary | ICD-10-CM | POA: Diagnosis not present

## 2019-12-25 DIAGNOSIS — Z9181 History of falling: Secondary | ICD-10-CM | POA: Diagnosis not present

## 2019-12-25 DIAGNOSIS — S42212D Unspecified displaced fracture of surgical neck of left humerus, subsequent encounter for fracture with routine healing: Secondary | ICD-10-CM | POA: Diagnosis not present

## 2019-12-25 DIAGNOSIS — R531 Weakness: Secondary | ICD-10-CM | POA: Diagnosis not present

## 2019-12-26 DIAGNOSIS — R2681 Unsteadiness on feet: Secondary | ICD-10-CM | POA: Diagnosis not present

## 2019-12-26 DIAGNOSIS — Z9181 History of falling: Secondary | ICD-10-CM | POA: Diagnosis not present

## 2019-12-27 DIAGNOSIS — R2681 Unsteadiness on feet: Secondary | ICD-10-CM | POA: Diagnosis not present

## 2019-12-27 DIAGNOSIS — Z9181 History of falling: Secondary | ICD-10-CM | POA: Diagnosis not present

## 2019-12-28 DIAGNOSIS — R531 Weakness: Secondary | ICD-10-CM | POA: Diagnosis not present

## 2019-12-28 DIAGNOSIS — S42212D Unspecified displaced fracture of surgical neck of left humerus, subsequent encounter for fracture with routine healing: Secondary | ICD-10-CM | POA: Diagnosis not present

## 2019-12-28 DIAGNOSIS — F0391 Unspecified dementia with behavioral disturbance: Secondary | ICD-10-CM | POA: Diagnosis not present

## 2019-12-28 DIAGNOSIS — Z9181 History of falling: Secondary | ICD-10-CM | POA: Diagnosis not present

## 2020-01-02 DIAGNOSIS — R531 Weakness: Secondary | ICD-10-CM | POA: Diagnosis not present

## 2020-01-02 DIAGNOSIS — Z9181 History of falling: Secondary | ICD-10-CM | POA: Diagnosis not present

## 2020-01-02 DIAGNOSIS — F0391 Unspecified dementia with behavioral disturbance: Secondary | ICD-10-CM | POA: Diagnosis not present

## 2020-01-02 DIAGNOSIS — S42212D Unspecified displaced fracture of surgical neck of left humerus, subsequent encounter for fracture with routine healing: Secondary | ICD-10-CM | POA: Diagnosis not present

## 2020-01-02 DIAGNOSIS — R2681 Unsteadiness on feet: Secondary | ICD-10-CM | POA: Diagnosis not present

## 2020-01-03 DIAGNOSIS — Z9181 History of falling: Secondary | ICD-10-CM | POA: Diagnosis not present

## 2020-01-03 DIAGNOSIS — R2681 Unsteadiness on feet: Secondary | ICD-10-CM | POA: Diagnosis not present

## 2020-01-09 DIAGNOSIS — R2681 Unsteadiness on feet: Secondary | ICD-10-CM | POA: Diagnosis not present

## 2020-01-09 DIAGNOSIS — Z9181 History of falling: Secondary | ICD-10-CM | POA: Diagnosis not present

## 2020-01-12 DIAGNOSIS — S299XXA Unspecified injury of thorax, initial encounter: Secondary | ICD-10-CM | POA: Diagnosis not present

## 2020-01-12 DIAGNOSIS — S199XXA Unspecified injury of neck, initial encounter: Secondary | ICD-10-CM | POA: Diagnosis not present

## 2020-01-12 DIAGNOSIS — W1811XA Fall from or off toilet without subsequent striking against object, initial encounter: Secondary | ICD-10-CM | POA: Diagnosis not present

## 2020-01-12 DIAGNOSIS — S098XXA Other specified injuries of head, initial encounter: Secondary | ICD-10-CM | POA: Diagnosis not present

## 2020-01-12 DIAGNOSIS — S42215A Unspecified nondisplaced fracture of surgical neck of left humerus, initial encounter for closed fracture: Secondary | ICD-10-CM | POA: Diagnosis not present

## 2020-01-12 DIAGNOSIS — S0990XA Unspecified injury of head, initial encounter: Secondary | ICD-10-CM | POA: Diagnosis not present

## 2020-01-12 DIAGNOSIS — S3991XA Unspecified injury of abdomen, initial encounter: Secondary | ICD-10-CM | POA: Diagnosis not present

## 2020-01-12 DIAGNOSIS — R1084 Generalized abdominal pain: Secondary | ICD-10-CM | POA: Diagnosis not present

## 2020-01-12 DIAGNOSIS — M25522 Pain in left elbow: Secondary | ICD-10-CM | POA: Diagnosis not present

## 2020-01-12 DIAGNOSIS — S59902A Unspecified injury of left elbow, initial encounter: Secondary | ICD-10-CM | POA: Diagnosis not present

## 2020-01-12 DIAGNOSIS — S0090XA Unspecified superficial injury of unspecified part of head, initial encounter: Secondary | ICD-10-CM | POA: Diagnosis not present

## 2020-01-12 DIAGNOSIS — R7989 Other specified abnormal findings of blood chemistry: Secondary | ICD-10-CM | POA: Diagnosis not present

## 2020-01-13 DIAGNOSIS — R269 Unspecified abnormalities of gait and mobility: Secondary | ICD-10-CM | POA: Diagnosis not present

## 2020-01-13 DIAGNOSIS — S2242XA Multiple fractures of ribs, left side, initial encounter for closed fracture: Secondary | ICD-10-CM | POA: Diagnosis not present

## 2020-01-16 DIAGNOSIS — M25512 Pain in left shoulder: Secondary | ICD-10-CM | POA: Diagnosis not present

## 2020-01-17 DIAGNOSIS — Z9181 History of falling: Secondary | ICD-10-CM | POA: Diagnosis not present

## 2020-01-17 DIAGNOSIS — S42212D Unspecified displaced fracture of surgical neck of left humerus, subsequent encounter for fracture with routine healing: Secondary | ICD-10-CM | POA: Diagnosis not present

## 2020-01-17 DIAGNOSIS — R2681 Unsteadiness on feet: Secondary | ICD-10-CM | POA: Diagnosis not present

## 2020-01-17 DIAGNOSIS — R531 Weakness: Secondary | ICD-10-CM | POA: Diagnosis not present

## 2020-01-17 DIAGNOSIS — F0391 Unspecified dementia with behavioral disturbance: Secondary | ICD-10-CM | POA: Diagnosis not present

## 2020-01-18 DIAGNOSIS — Z9181 History of falling: Secondary | ICD-10-CM | POA: Diagnosis not present

## 2020-01-18 DIAGNOSIS — R2681 Unsteadiness on feet: Secondary | ICD-10-CM | POA: Diagnosis not present

## 2020-01-22 DIAGNOSIS — E119 Type 2 diabetes mellitus without complications: Secondary | ICD-10-CM | POA: Diagnosis not present

## 2020-01-22 DIAGNOSIS — I10 Essential (primary) hypertension: Secondary | ICD-10-CM | POA: Diagnosis not present

## 2020-01-22 DIAGNOSIS — F419 Anxiety disorder, unspecified: Secondary | ICD-10-CM | POA: Diagnosis not present

## 2020-01-22 DIAGNOSIS — Z9181 History of falling: Secondary | ICD-10-CM | POA: Diagnosis not present

## 2020-01-22 DIAGNOSIS — R2681 Unsteadiness on feet: Secondary | ICD-10-CM | POA: Diagnosis not present

## 2020-01-22 DIAGNOSIS — M25512 Pain in left shoulder: Secondary | ICD-10-CM | POA: Diagnosis not present

## 2020-01-30 DIAGNOSIS — R5383 Other fatigue: Secondary | ICD-10-CM | POA: Diagnosis not present

## 2020-01-30 DIAGNOSIS — M25512 Pain in left shoulder: Secondary | ICD-10-CM | POA: Diagnosis not present

## 2020-01-30 DIAGNOSIS — F331 Major depressive disorder, recurrent, moderate: Secondary | ICD-10-CM | POA: Diagnosis not present

## 2020-01-30 DIAGNOSIS — R4689 Other symptoms and signs involving appearance and behavior: Secondary | ICD-10-CM | POA: Diagnosis not present

## 2020-01-30 DIAGNOSIS — Z79899 Other long term (current) drug therapy: Secondary | ICD-10-CM | POA: Diagnosis not present

## 2020-01-30 DIAGNOSIS — Z87891 Personal history of nicotine dependence: Secondary | ICD-10-CM | POA: Diagnosis not present

## 2020-01-30 DIAGNOSIS — F0390 Unspecified dementia without behavioral disturbance: Secondary | ICD-10-CM | POA: Diagnosis not present

## 2020-01-30 DIAGNOSIS — Z9181 History of falling: Secondary | ICD-10-CM | POA: Diagnosis not present

## 2020-01-30 DIAGNOSIS — F4325 Adjustment disorder with mixed disturbance of emotions and conduct: Secondary | ICD-10-CM | POA: Diagnosis not present

## 2020-01-30 DIAGNOSIS — F419 Anxiety disorder, unspecified: Secondary | ICD-10-CM | POA: Diagnosis not present

## 2020-01-30 DIAGNOSIS — I10 Essential (primary) hypertension: Secondary | ICD-10-CM | POA: Diagnosis not present

## 2020-01-30 DIAGNOSIS — E119 Type 2 diabetes mellitus without complications: Secondary | ICD-10-CM | POA: Diagnosis not present

## 2020-01-30 DIAGNOSIS — F0281 Dementia in other diseases classified elsewhere with behavioral disturbance: Secondary | ICD-10-CM | POA: Diagnosis not present

## 2020-01-30 DIAGNOSIS — Z9183 Wandering in diseases classified elsewhere: Secondary | ICD-10-CM | POA: Diagnosis not present

## 2020-01-31 DIAGNOSIS — G20 Parkinson's disease: Secondary | ICD-10-CM | POA: Diagnosis not present

## 2020-01-31 DIAGNOSIS — M25512 Pain in left shoulder: Secondary | ICD-10-CM | POA: Diagnosis not present

## 2020-01-31 DIAGNOSIS — F0390 Unspecified dementia without behavioral disturbance: Secondary | ICD-10-CM | POA: Diagnosis not present

## 2020-01-31 DIAGNOSIS — F419 Anxiety disorder, unspecified: Secondary | ICD-10-CM | POA: Diagnosis not present

## 2020-01-31 DIAGNOSIS — Z515 Encounter for palliative care: Secondary | ICD-10-CM | POA: Diagnosis not present

## 2020-01-31 DIAGNOSIS — I10 Essential (primary) hypertension: Secondary | ICD-10-CM | POA: Diagnosis not present

## 2020-02-04 DIAGNOSIS — I10 Essential (primary) hypertension: Secondary | ICD-10-CM | POA: Diagnosis not present

## 2020-02-04 DIAGNOSIS — E119 Type 2 diabetes mellitus without complications: Secondary | ICD-10-CM | POA: Diagnosis not present

## 2020-02-04 DIAGNOSIS — Z87891 Personal history of nicotine dependence: Secondary | ICD-10-CM | POA: Diagnosis not present

## 2020-02-04 DIAGNOSIS — F0390 Unspecified dementia without behavioral disturbance: Secondary | ICD-10-CM | POA: Diagnosis not present

## 2020-02-04 DIAGNOSIS — M25512 Pain in left shoulder: Secondary | ICD-10-CM | POA: Diagnosis not present

## 2020-02-04 DIAGNOSIS — F419 Anxiety disorder, unspecified: Secondary | ICD-10-CM | POA: Diagnosis not present

## 2020-02-06 DIAGNOSIS — M25512 Pain in left shoulder: Secondary | ICD-10-CM | POA: Diagnosis not present

## 2020-02-06 DIAGNOSIS — E119 Type 2 diabetes mellitus without complications: Secondary | ICD-10-CM | POA: Diagnosis not present

## 2020-02-06 DIAGNOSIS — F0390 Unspecified dementia without behavioral disturbance: Secondary | ICD-10-CM | POA: Diagnosis not present

## 2020-02-06 DIAGNOSIS — Z7409 Other reduced mobility: Secondary | ICD-10-CM | POA: Diagnosis not present

## 2020-02-06 DIAGNOSIS — I10 Essential (primary) hypertension: Secondary | ICD-10-CM | POA: Diagnosis not present

## 2020-02-06 DIAGNOSIS — Z789 Other specified health status: Secondary | ICD-10-CM | POA: Diagnosis not present

## 2020-02-06 DIAGNOSIS — G5690 Unspecified mononeuropathy of unspecified upper limb: Secondary | ICD-10-CM | POA: Diagnosis not present

## 2020-02-06 DIAGNOSIS — Z87891 Personal history of nicotine dependence: Secondary | ICD-10-CM | POA: Diagnosis not present

## 2020-02-06 DIAGNOSIS — F419 Anxiety disorder, unspecified: Secondary | ICD-10-CM | POA: Diagnosis not present

## 2020-02-06 DIAGNOSIS — R4189 Other symptoms and signs involving cognitive functions and awareness: Secondary | ICD-10-CM | POA: Diagnosis not present

## 2020-02-06 DIAGNOSIS — R29898 Other symptoms and signs involving the musculoskeletal system: Secondary | ICD-10-CM | POA: Diagnosis not present

## 2020-02-07 DIAGNOSIS — Z79899 Other long term (current) drug therapy: Secondary | ICD-10-CM | POA: Diagnosis not present

## 2020-02-07 DIAGNOSIS — Z9183 Wandering in diseases classified elsewhere: Secondary | ICD-10-CM | POA: Diagnosis not present

## 2020-02-07 DIAGNOSIS — F4325 Adjustment disorder with mixed disturbance of emotions and conduct: Secondary | ICD-10-CM | POA: Diagnosis not present

## 2020-02-07 DIAGNOSIS — F0281 Dementia in other diseases classified elsewhere with behavioral disturbance: Secondary | ICD-10-CM | POA: Diagnosis not present

## 2020-02-07 DIAGNOSIS — R4689 Other symptoms and signs involving appearance and behavior: Secondary | ICD-10-CM | POA: Diagnosis not present

## 2020-02-07 DIAGNOSIS — F331 Major depressive disorder, recurrent, moderate: Secondary | ICD-10-CM | POA: Diagnosis not present

## 2020-02-07 DIAGNOSIS — Z9181 History of falling: Secondary | ICD-10-CM | POA: Diagnosis not present

## 2020-02-07 DIAGNOSIS — R5383 Other fatigue: Secondary | ICD-10-CM | POA: Diagnosis not present

## 2020-02-07 DIAGNOSIS — R4182 Altered mental status, unspecified: Secondary | ICD-10-CM | POA: Diagnosis not present

## 2020-02-08 DIAGNOSIS — I10 Essential (primary) hypertension: Secondary | ICD-10-CM | POA: Diagnosis not present

## 2020-02-08 DIAGNOSIS — Z87891 Personal history of nicotine dependence: Secondary | ICD-10-CM | POA: Diagnosis not present

## 2020-02-08 DIAGNOSIS — M25512 Pain in left shoulder: Secondary | ICD-10-CM | POA: Diagnosis not present

## 2020-02-08 DIAGNOSIS — E119 Type 2 diabetes mellitus without complications: Secondary | ICD-10-CM | POA: Diagnosis not present

## 2020-02-08 DIAGNOSIS — F419 Anxiety disorder, unspecified: Secondary | ICD-10-CM | POA: Diagnosis not present

## 2020-02-08 DIAGNOSIS — F0390 Unspecified dementia without behavioral disturbance: Secondary | ICD-10-CM | POA: Diagnosis not present

## 2020-02-11 DIAGNOSIS — F419 Anxiety disorder, unspecified: Secondary | ICD-10-CM | POA: Diagnosis not present

## 2020-02-11 DIAGNOSIS — M25512 Pain in left shoulder: Secondary | ICD-10-CM | POA: Diagnosis not present

## 2020-02-11 DIAGNOSIS — I10 Essential (primary) hypertension: Secondary | ICD-10-CM | POA: Diagnosis not present

## 2020-02-11 DIAGNOSIS — F0390 Unspecified dementia without behavioral disturbance: Secondary | ICD-10-CM | POA: Diagnosis not present

## 2020-02-11 DIAGNOSIS — Z87891 Personal history of nicotine dependence: Secondary | ICD-10-CM | POA: Diagnosis not present

## 2020-02-11 DIAGNOSIS — E119 Type 2 diabetes mellitus without complications: Secondary | ICD-10-CM | POA: Diagnosis not present

## 2020-02-12 DIAGNOSIS — E119 Type 2 diabetes mellitus without complications: Secondary | ICD-10-CM | POA: Diagnosis not present

## 2020-02-12 DIAGNOSIS — Z79899 Other long term (current) drug therapy: Secondary | ICD-10-CM | POA: Diagnosis not present

## 2020-02-12 DIAGNOSIS — F331 Major depressive disorder, recurrent, moderate: Secondary | ICD-10-CM | POA: Diagnosis not present

## 2020-02-12 DIAGNOSIS — F0390 Unspecified dementia without behavioral disturbance: Secondary | ICD-10-CM | POA: Diagnosis not present

## 2020-02-12 DIAGNOSIS — Z9183 Wandering in diseases classified elsewhere: Secondary | ICD-10-CM | POA: Diagnosis not present

## 2020-02-12 DIAGNOSIS — Z9181 History of falling: Secondary | ICD-10-CM | POA: Diagnosis not present

## 2020-02-12 DIAGNOSIS — I10 Essential (primary) hypertension: Secondary | ICD-10-CM | POA: Diagnosis not present

## 2020-02-12 DIAGNOSIS — F0281 Dementia in other diseases classified elsewhere with behavioral disturbance: Secondary | ICD-10-CM | POA: Diagnosis not present

## 2020-02-12 DIAGNOSIS — R5383 Other fatigue: Secondary | ICD-10-CM | POA: Diagnosis not present

## 2020-02-12 DIAGNOSIS — Z87891 Personal history of nicotine dependence: Secondary | ICD-10-CM | POA: Diagnosis not present

## 2020-02-12 DIAGNOSIS — F419 Anxiety disorder, unspecified: Secondary | ICD-10-CM | POA: Diagnosis not present

## 2020-02-12 DIAGNOSIS — M25512 Pain in left shoulder: Secondary | ICD-10-CM | POA: Diagnosis not present

## 2020-02-12 DIAGNOSIS — R4689 Other symptoms and signs involving appearance and behavior: Secondary | ICD-10-CM | POA: Diagnosis not present

## 2020-02-13 DIAGNOSIS — M25512 Pain in left shoulder: Secondary | ICD-10-CM | POA: Diagnosis not present

## 2020-02-15 DIAGNOSIS — F419 Anxiety disorder, unspecified: Secondary | ICD-10-CM | POA: Diagnosis not present

## 2020-02-15 DIAGNOSIS — I10 Essential (primary) hypertension: Secondary | ICD-10-CM | POA: Diagnosis not present

## 2020-02-15 DIAGNOSIS — F0390 Unspecified dementia without behavioral disturbance: Secondary | ICD-10-CM | POA: Diagnosis not present

## 2020-02-15 DIAGNOSIS — Z87891 Personal history of nicotine dependence: Secondary | ICD-10-CM | POA: Diagnosis not present

## 2020-02-15 DIAGNOSIS — M25512 Pain in left shoulder: Secondary | ICD-10-CM | POA: Diagnosis not present

## 2020-02-15 DIAGNOSIS — E119 Type 2 diabetes mellitus without complications: Secondary | ICD-10-CM | POA: Diagnosis not present

## 2020-02-18 DIAGNOSIS — E119 Type 2 diabetes mellitus without complications: Secondary | ICD-10-CM | POA: Diagnosis not present

## 2020-02-18 DIAGNOSIS — M25512 Pain in left shoulder: Secondary | ICD-10-CM | POA: Diagnosis not present

## 2020-02-18 DIAGNOSIS — Z87891 Personal history of nicotine dependence: Secondary | ICD-10-CM | POA: Diagnosis not present

## 2020-02-18 DIAGNOSIS — F419 Anxiety disorder, unspecified: Secondary | ICD-10-CM | POA: Diagnosis not present

## 2020-02-18 DIAGNOSIS — I10 Essential (primary) hypertension: Secondary | ICD-10-CM | POA: Diagnosis not present

## 2020-02-18 DIAGNOSIS — F0390 Unspecified dementia without behavioral disturbance: Secondary | ICD-10-CM | POA: Diagnosis not present

## 2020-02-19 DIAGNOSIS — Z87891 Personal history of nicotine dependence: Secondary | ICD-10-CM | POA: Diagnosis not present

## 2020-02-19 DIAGNOSIS — E119 Type 2 diabetes mellitus without complications: Secondary | ICD-10-CM | POA: Diagnosis not present

## 2020-02-19 DIAGNOSIS — F419 Anxiety disorder, unspecified: Secondary | ICD-10-CM | POA: Diagnosis not present

## 2020-02-19 DIAGNOSIS — M25512 Pain in left shoulder: Secondary | ICD-10-CM | POA: Diagnosis not present

## 2020-02-19 DIAGNOSIS — I10 Essential (primary) hypertension: Secondary | ICD-10-CM | POA: Diagnosis not present

## 2020-02-19 DIAGNOSIS — F0390 Unspecified dementia without behavioral disturbance: Secondary | ICD-10-CM | POA: Diagnosis not present

## 2020-02-20 DIAGNOSIS — Z79899 Other long term (current) drug therapy: Secondary | ICD-10-CM | POA: Diagnosis not present

## 2020-02-20 DIAGNOSIS — F331 Major depressive disorder, recurrent, moderate: Secondary | ICD-10-CM | POA: Diagnosis not present

## 2020-02-20 DIAGNOSIS — F0281 Dementia in other diseases classified elsewhere with behavioral disturbance: Secondary | ICD-10-CM | POA: Diagnosis not present

## 2020-02-20 DIAGNOSIS — R4689 Other symptoms and signs involving appearance and behavior: Secondary | ICD-10-CM | POA: Diagnosis not present

## 2020-02-20 DIAGNOSIS — Z9183 Wandering in diseases classified elsewhere: Secondary | ICD-10-CM | POA: Diagnosis not present

## 2020-02-20 DIAGNOSIS — R5383 Other fatigue: Secondary | ICD-10-CM | POA: Diagnosis not present

## 2020-02-20 DIAGNOSIS — Z9181 History of falling: Secondary | ICD-10-CM | POA: Diagnosis not present

## 2020-02-21 DIAGNOSIS — R4182 Altered mental status, unspecified: Secondary | ICD-10-CM | POA: Diagnosis not present

## 2020-02-22 DIAGNOSIS — F0390 Unspecified dementia without behavioral disturbance: Secondary | ICD-10-CM | POA: Diagnosis not present

## 2020-02-22 DIAGNOSIS — F419 Anxiety disorder, unspecified: Secondary | ICD-10-CM | POA: Diagnosis not present

## 2020-02-22 DIAGNOSIS — M25512 Pain in left shoulder: Secondary | ICD-10-CM | POA: Diagnosis not present

## 2020-02-22 DIAGNOSIS — E119 Type 2 diabetes mellitus without complications: Secondary | ICD-10-CM | POA: Diagnosis not present

## 2020-02-22 DIAGNOSIS — I10 Essential (primary) hypertension: Secondary | ICD-10-CM | POA: Diagnosis not present

## 2020-02-22 DIAGNOSIS — Z87891 Personal history of nicotine dependence: Secondary | ICD-10-CM | POA: Diagnosis not present

## 2020-02-26 DIAGNOSIS — Z87891 Personal history of nicotine dependence: Secondary | ICD-10-CM | POA: Diagnosis not present

## 2020-02-26 DIAGNOSIS — E119 Type 2 diabetes mellitus without complications: Secondary | ICD-10-CM | POA: Diagnosis not present

## 2020-02-26 DIAGNOSIS — M25512 Pain in left shoulder: Secondary | ICD-10-CM | POA: Diagnosis not present

## 2020-02-26 DIAGNOSIS — I10 Essential (primary) hypertension: Secondary | ICD-10-CM | POA: Diagnosis not present

## 2020-02-26 DIAGNOSIS — F419 Anxiety disorder, unspecified: Secondary | ICD-10-CM | POA: Diagnosis not present

## 2020-02-26 DIAGNOSIS — F0390 Unspecified dementia without behavioral disturbance: Secondary | ICD-10-CM | POA: Diagnosis not present

## 2020-02-27 DIAGNOSIS — F419 Anxiety disorder, unspecified: Secondary | ICD-10-CM | POA: Diagnosis not present

## 2020-02-27 DIAGNOSIS — Z87891 Personal history of nicotine dependence: Secondary | ICD-10-CM | POA: Diagnosis not present

## 2020-02-27 DIAGNOSIS — I10 Essential (primary) hypertension: Secondary | ICD-10-CM | POA: Diagnosis not present

## 2020-02-27 DIAGNOSIS — M25512 Pain in left shoulder: Secondary | ICD-10-CM | POA: Diagnosis not present

## 2020-02-27 DIAGNOSIS — E119 Type 2 diabetes mellitus without complications: Secondary | ICD-10-CM | POA: Diagnosis not present

## 2020-02-27 DIAGNOSIS — F0390 Unspecified dementia without behavioral disturbance: Secondary | ICD-10-CM | POA: Diagnosis not present

## 2020-02-28 DIAGNOSIS — E119 Type 2 diabetes mellitus without complications: Secondary | ICD-10-CM | POA: Diagnosis not present

## 2020-02-28 DIAGNOSIS — M25512 Pain in left shoulder: Secondary | ICD-10-CM | POA: Diagnosis not present

## 2020-02-28 DIAGNOSIS — F0390 Unspecified dementia without behavioral disturbance: Secondary | ICD-10-CM | POA: Diagnosis not present

## 2020-02-28 DIAGNOSIS — F419 Anxiety disorder, unspecified: Secondary | ICD-10-CM | POA: Diagnosis not present

## 2020-02-28 DIAGNOSIS — Z87891 Personal history of nicotine dependence: Secondary | ICD-10-CM | POA: Diagnosis not present

## 2020-02-28 DIAGNOSIS — I10 Essential (primary) hypertension: Secondary | ICD-10-CM | POA: Diagnosis not present

## 2020-02-29 DIAGNOSIS — F0390 Unspecified dementia without behavioral disturbance: Secondary | ICD-10-CM | POA: Diagnosis not present

## 2020-02-29 DIAGNOSIS — I10 Essential (primary) hypertension: Secondary | ICD-10-CM | POA: Diagnosis not present

## 2020-02-29 DIAGNOSIS — E119 Type 2 diabetes mellitus without complications: Secondary | ICD-10-CM | POA: Diagnosis not present

## 2020-02-29 DIAGNOSIS — Z87891 Personal history of nicotine dependence: Secondary | ICD-10-CM | POA: Diagnosis not present

## 2020-02-29 DIAGNOSIS — F419 Anxiety disorder, unspecified: Secondary | ICD-10-CM | POA: Diagnosis not present

## 2020-02-29 DIAGNOSIS — M25512 Pain in left shoulder: Secondary | ICD-10-CM | POA: Diagnosis not present

## 2020-03-03 DIAGNOSIS — E119 Type 2 diabetes mellitus without complications: Secondary | ICD-10-CM | POA: Diagnosis not present

## 2020-03-03 DIAGNOSIS — M25512 Pain in left shoulder: Secondary | ICD-10-CM | POA: Diagnosis not present

## 2020-03-03 DIAGNOSIS — Z87891 Personal history of nicotine dependence: Secondary | ICD-10-CM | POA: Diagnosis not present

## 2020-03-03 DIAGNOSIS — F419 Anxiety disorder, unspecified: Secondary | ICD-10-CM | POA: Diagnosis not present

## 2020-03-03 DIAGNOSIS — F0390 Unspecified dementia without behavioral disturbance: Secondary | ICD-10-CM | POA: Diagnosis not present

## 2020-03-03 DIAGNOSIS — I10 Essential (primary) hypertension: Secondary | ICD-10-CM | POA: Diagnosis not present

## 2020-03-05 DIAGNOSIS — F0390 Unspecified dementia without behavioral disturbance: Secondary | ICD-10-CM | POA: Diagnosis not present

## 2020-03-05 DIAGNOSIS — Z87891 Personal history of nicotine dependence: Secondary | ICD-10-CM | POA: Diagnosis not present

## 2020-03-05 DIAGNOSIS — M25512 Pain in left shoulder: Secondary | ICD-10-CM | POA: Diagnosis not present

## 2020-03-05 DIAGNOSIS — E119 Type 2 diabetes mellitus without complications: Secondary | ICD-10-CM | POA: Diagnosis not present

## 2020-03-05 DIAGNOSIS — F419 Anxiety disorder, unspecified: Secondary | ICD-10-CM | POA: Diagnosis not present

## 2020-03-05 DIAGNOSIS — I10 Essential (primary) hypertension: Secondary | ICD-10-CM | POA: Diagnosis not present

## 2020-03-06 DIAGNOSIS — M25512 Pain in left shoulder: Secondary | ICD-10-CM | POA: Diagnosis not present

## 2020-03-06 DIAGNOSIS — Z87891 Personal history of nicotine dependence: Secondary | ICD-10-CM | POA: Diagnosis not present

## 2020-03-06 DIAGNOSIS — I10 Essential (primary) hypertension: Secondary | ICD-10-CM | POA: Diagnosis not present

## 2020-03-06 DIAGNOSIS — F0390 Unspecified dementia without behavioral disturbance: Secondary | ICD-10-CM | POA: Diagnosis not present

## 2020-03-06 DIAGNOSIS — F419 Anxiety disorder, unspecified: Secondary | ICD-10-CM | POA: Diagnosis not present

## 2020-03-06 DIAGNOSIS — E119 Type 2 diabetes mellitus without complications: Secondary | ICD-10-CM | POA: Diagnosis not present

## 2020-03-07 DIAGNOSIS — E119 Type 2 diabetes mellitus without complications: Secondary | ICD-10-CM | POA: Diagnosis not present

## 2020-03-07 DIAGNOSIS — I10 Essential (primary) hypertension: Secondary | ICD-10-CM | POA: Diagnosis not present

## 2020-03-07 DIAGNOSIS — Z87891 Personal history of nicotine dependence: Secondary | ICD-10-CM | POA: Diagnosis not present

## 2020-03-07 DIAGNOSIS — M25512 Pain in left shoulder: Secondary | ICD-10-CM | POA: Diagnosis not present

## 2020-03-07 DIAGNOSIS — F0390 Unspecified dementia without behavioral disturbance: Secondary | ICD-10-CM | POA: Diagnosis not present

## 2020-03-07 DIAGNOSIS — F419 Anxiety disorder, unspecified: Secondary | ICD-10-CM | POA: Diagnosis not present

## 2020-03-10 DIAGNOSIS — E119 Type 2 diabetes mellitus without complications: Secondary | ICD-10-CM | POA: Diagnosis not present

## 2020-03-10 DIAGNOSIS — M25512 Pain in left shoulder: Secondary | ICD-10-CM | POA: Diagnosis not present

## 2020-03-10 DIAGNOSIS — F0390 Unspecified dementia without behavioral disturbance: Secondary | ICD-10-CM | POA: Diagnosis not present

## 2020-03-10 DIAGNOSIS — Z87891 Personal history of nicotine dependence: Secondary | ICD-10-CM | POA: Diagnosis not present

## 2020-03-10 DIAGNOSIS — F419 Anxiety disorder, unspecified: Secondary | ICD-10-CM | POA: Diagnosis not present

## 2020-03-10 DIAGNOSIS — I10 Essential (primary) hypertension: Secondary | ICD-10-CM | POA: Diagnosis not present

## 2020-03-11 DIAGNOSIS — I10 Essential (primary) hypertension: Secondary | ICD-10-CM | POA: Diagnosis not present

## 2020-03-11 DIAGNOSIS — F0390 Unspecified dementia without behavioral disturbance: Secondary | ICD-10-CM | POA: Diagnosis not present

## 2020-03-11 DIAGNOSIS — Z87891 Personal history of nicotine dependence: Secondary | ICD-10-CM | POA: Diagnosis not present

## 2020-03-11 DIAGNOSIS — M25512 Pain in left shoulder: Secondary | ICD-10-CM | POA: Diagnosis not present

## 2020-03-11 DIAGNOSIS — F419 Anxiety disorder, unspecified: Secondary | ICD-10-CM | POA: Diagnosis not present

## 2020-03-11 DIAGNOSIS — E119 Type 2 diabetes mellitus without complications: Secondary | ICD-10-CM | POA: Diagnosis not present

## 2020-03-13 DIAGNOSIS — E119 Type 2 diabetes mellitus without complications: Secondary | ICD-10-CM | POA: Diagnosis not present

## 2020-03-13 DIAGNOSIS — F419 Anxiety disorder, unspecified: Secondary | ICD-10-CM | POA: Diagnosis not present

## 2020-03-13 DIAGNOSIS — Z87891 Personal history of nicotine dependence: Secondary | ICD-10-CM | POA: Diagnosis not present

## 2020-03-13 DIAGNOSIS — M25512 Pain in left shoulder: Secondary | ICD-10-CM | POA: Diagnosis not present

## 2020-03-13 DIAGNOSIS — I10 Essential (primary) hypertension: Secondary | ICD-10-CM | POA: Diagnosis not present

## 2020-03-13 DIAGNOSIS — F0390 Unspecified dementia without behavioral disturbance: Secondary | ICD-10-CM | POA: Diagnosis not present

## 2020-03-14 DIAGNOSIS — M25512 Pain in left shoulder: Secondary | ICD-10-CM | POA: Diagnosis not present

## 2020-03-14 DIAGNOSIS — I10 Essential (primary) hypertension: Secondary | ICD-10-CM | POA: Diagnosis not present

## 2020-03-14 DIAGNOSIS — F419 Anxiety disorder, unspecified: Secondary | ICD-10-CM | POA: Diagnosis not present

## 2020-03-14 DIAGNOSIS — E119 Type 2 diabetes mellitus without complications: Secondary | ICD-10-CM | POA: Diagnosis not present

## 2020-03-14 DIAGNOSIS — Z87891 Personal history of nicotine dependence: Secondary | ICD-10-CM | POA: Diagnosis not present

## 2020-03-14 DIAGNOSIS — F0390 Unspecified dementia without behavioral disturbance: Secondary | ICD-10-CM | POA: Diagnosis not present

## 2020-03-17 DIAGNOSIS — F419 Anxiety disorder, unspecified: Secondary | ICD-10-CM | POA: Diagnosis not present

## 2020-03-17 DIAGNOSIS — I10 Essential (primary) hypertension: Secondary | ICD-10-CM | POA: Diagnosis not present

## 2020-03-17 DIAGNOSIS — Z87891 Personal history of nicotine dependence: Secondary | ICD-10-CM | POA: Diagnosis not present

## 2020-03-17 DIAGNOSIS — M25512 Pain in left shoulder: Secondary | ICD-10-CM | POA: Diagnosis not present

## 2020-03-17 DIAGNOSIS — E785 Hyperlipidemia, unspecified: Secondary | ICD-10-CM | POA: Diagnosis not present

## 2020-03-17 DIAGNOSIS — E039 Hypothyroidism, unspecified: Secondary | ICD-10-CM | POA: Diagnosis not present

## 2020-03-17 DIAGNOSIS — N390 Urinary tract infection, site not specified: Secondary | ICD-10-CM | POA: Diagnosis not present

## 2020-03-17 DIAGNOSIS — F0390 Unspecified dementia without behavioral disturbance: Secondary | ICD-10-CM | POA: Diagnosis not present

## 2020-03-17 DIAGNOSIS — E119 Type 2 diabetes mellitus without complications: Secondary | ICD-10-CM | POA: Diagnosis not present

## 2020-03-19 DIAGNOSIS — F0390 Unspecified dementia without behavioral disturbance: Secondary | ICD-10-CM | POA: Diagnosis not present

## 2020-03-19 DIAGNOSIS — F419 Anxiety disorder, unspecified: Secondary | ICD-10-CM | POA: Diagnosis not present

## 2020-03-19 DIAGNOSIS — M25512 Pain in left shoulder: Secondary | ICD-10-CM | POA: Diagnosis not present

## 2020-03-19 DIAGNOSIS — I10 Essential (primary) hypertension: Secondary | ICD-10-CM | POA: Diagnosis not present

## 2020-03-19 DIAGNOSIS — E119 Type 2 diabetes mellitus without complications: Secondary | ICD-10-CM | POA: Diagnosis not present

## 2020-03-19 DIAGNOSIS — Z87891 Personal history of nicotine dependence: Secondary | ICD-10-CM | POA: Diagnosis not present

## 2020-03-20 DIAGNOSIS — M25512 Pain in left shoulder: Secondary | ICD-10-CM | POA: Diagnosis not present

## 2020-03-20 DIAGNOSIS — F419 Anxiety disorder, unspecified: Secondary | ICD-10-CM | POA: Diagnosis not present

## 2020-03-20 DIAGNOSIS — Z87891 Personal history of nicotine dependence: Secondary | ICD-10-CM | POA: Diagnosis not present

## 2020-03-20 DIAGNOSIS — E119 Type 2 diabetes mellitus without complications: Secondary | ICD-10-CM | POA: Diagnosis not present

## 2020-03-20 DIAGNOSIS — I10 Essential (primary) hypertension: Secondary | ICD-10-CM | POA: Diagnosis not present

## 2020-03-20 DIAGNOSIS — F0390 Unspecified dementia without behavioral disturbance: Secondary | ICD-10-CM | POA: Diagnosis not present

## 2020-03-21 DIAGNOSIS — M25512 Pain in left shoulder: Secondary | ICD-10-CM | POA: Diagnosis not present

## 2020-03-21 DIAGNOSIS — F419 Anxiety disorder, unspecified: Secondary | ICD-10-CM | POA: Diagnosis not present

## 2020-03-21 DIAGNOSIS — I10 Essential (primary) hypertension: Secondary | ICD-10-CM | POA: Diagnosis not present

## 2020-03-21 DIAGNOSIS — F0390 Unspecified dementia without behavioral disturbance: Secondary | ICD-10-CM | POA: Diagnosis not present

## 2020-03-21 DIAGNOSIS — E119 Type 2 diabetes mellitus without complications: Secondary | ICD-10-CM | POA: Diagnosis not present

## 2020-03-21 DIAGNOSIS — Z87891 Personal history of nicotine dependence: Secondary | ICD-10-CM | POA: Diagnosis not present

## 2020-03-24 DIAGNOSIS — Z87891 Personal history of nicotine dependence: Secondary | ICD-10-CM | POA: Diagnosis not present

## 2020-03-24 DIAGNOSIS — E119 Type 2 diabetes mellitus without complications: Secondary | ICD-10-CM | POA: Diagnosis not present

## 2020-03-24 DIAGNOSIS — I10 Essential (primary) hypertension: Secondary | ICD-10-CM | POA: Diagnosis not present

## 2020-03-24 DIAGNOSIS — F0390 Unspecified dementia without behavioral disturbance: Secondary | ICD-10-CM | POA: Diagnosis not present

## 2020-03-24 DIAGNOSIS — M25512 Pain in left shoulder: Secondary | ICD-10-CM | POA: Diagnosis not present

## 2020-03-24 DIAGNOSIS — F419 Anxiety disorder, unspecified: Secondary | ICD-10-CM | POA: Diagnosis not present

## 2020-03-25 DIAGNOSIS — Z87891 Personal history of nicotine dependence: Secondary | ICD-10-CM | POA: Diagnosis not present

## 2020-03-25 DIAGNOSIS — F419 Anxiety disorder, unspecified: Secondary | ICD-10-CM | POA: Diagnosis not present

## 2020-03-25 DIAGNOSIS — I10 Essential (primary) hypertension: Secondary | ICD-10-CM | POA: Diagnosis not present

## 2020-03-25 DIAGNOSIS — M25512 Pain in left shoulder: Secondary | ICD-10-CM | POA: Diagnosis not present

## 2020-03-25 DIAGNOSIS — E119 Type 2 diabetes mellitus without complications: Secondary | ICD-10-CM | POA: Diagnosis not present

## 2020-03-25 DIAGNOSIS — F0390 Unspecified dementia without behavioral disturbance: Secondary | ICD-10-CM | POA: Diagnosis not present

## 2020-03-26 DIAGNOSIS — E119 Type 2 diabetes mellitus without complications: Secondary | ICD-10-CM | POA: Diagnosis not present

## 2020-03-26 DIAGNOSIS — F0390 Unspecified dementia without behavioral disturbance: Secondary | ICD-10-CM | POA: Diagnosis not present

## 2020-03-26 DIAGNOSIS — F419 Anxiety disorder, unspecified: Secondary | ICD-10-CM | POA: Diagnosis not present

## 2020-03-26 DIAGNOSIS — M25512 Pain in left shoulder: Secondary | ICD-10-CM | POA: Diagnosis not present

## 2020-03-26 DIAGNOSIS — I10 Essential (primary) hypertension: Secondary | ICD-10-CM | POA: Diagnosis not present

## 2020-03-26 DIAGNOSIS — Z87891 Personal history of nicotine dependence: Secondary | ICD-10-CM | POA: Diagnosis not present

## 2020-03-27 DIAGNOSIS — F419 Anxiety disorder, unspecified: Secondary | ICD-10-CM | POA: Diagnosis not present

## 2020-03-27 DIAGNOSIS — Z87891 Personal history of nicotine dependence: Secondary | ICD-10-CM | POA: Diagnosis not present

## 2020-03-27 DIAGNOSIS — F0390 Unspecified dementia without behavioral disturbance: Secondary | ICD-10-CM | POA: Diagnosis not present

## 2020-03-27 DIAGNOSIS — E119 Type 2 diabetes mellitus without complications: Secondary | ICD-10-CM | POA: Diagnosis not present

## 2020-03-27 DIAGNOSIS — M25512 Pain in left shoulder: Secondary | ICD-10-CM | POA: Diagnosis not present

## 2020-03-27 DIAGNOSIS — I10 Essential (primary) hypertension: Secondary | ICD-10-CM | POA: Diagnosis not present

## 2020-03-28 DIAGNOSIS — Z87891 Personal history of nicotine dependence: Secondary | ICD-10-CM | POA: Diagnosis not present

## 2020-03-28 DIAGNOSIS — F419 Anxiety disorder, unspecified: Secondary | ICD-10-CM | POA: Diagnosis not present

## 2020-03-28 DIAGNOSIS — E119 Type 2 diabetes mellitus without complications: Secondary | ICD-10-CM | POA: Diagnosis not present

## 2020-03-28 DIAGNOSIS — I10 Essential (primary) hypertension: Secondary | ICD-10-CM | POA: Diagnosis not present

## 2020-03-28 DIAGNOSIS — F0390 Unspecified dementia without behavioral disturbance: Secondary | ICD-10-CM | POA: Diagnosis not present

## 2020-03-28 DIAGNOSIS — M25512 Pain in left shoulder: Secondary | ICD-10-CM | POA: Diagnosis not present

## 2020-03-30 DIAGNOSIS — E119 Type 2 diabetes mellitus without complications: Secondary | ICD-10-CM | POA: Diagnosis not present

## 2020-03-30 DIAGNOSIS — F419 Anxiety disorder, unspecified: Secondary | ICD-10-CM | POA: Diagnosis not present

## 2020-03-30 DIAGNOSIS — I10 Essential (primary) hypertension: Secondary | ICD-10-CM | POA: Diagnosis not present

## 2020-03-30 DIAGNOSIS — F0390 Unspecified dementia without behavioral disturbance: Secondary | ICD-10-CM | POA: Diagnosis not present

## 2020-03-30 DIAGNOSIS — M25512 Pain in left shoulder: Secondary | ICD-10-CM | POA: Diagnosis not present

## 2020-04-01 DIAGNOSIS — I10 Essential (primary) hypertension: Secondary | ICD-10-CM | POA: Diagnosis not present

## 2020-04-01 DIAGNOSIS — M25512 Pain in left shoulder: Secondary | ICD-10-CM | POA: Diagnosis not present

## 2020-04-01 DIAGNOSIS — E119 Type 2 diabetes mellitus without complications: Secondary | ICD-10-CM | POA: Diagnosis not present

## 2020-04-01 DIAGNOSIS — Z87891 Personal history of nicotine dependence: Secondary | ICD-10-CM | POA: Diagnosis not present

## 2020-04-01 DIAGNOSIS — F419 Anxiety disorder, unspecified: Secondary | ICD-10-CM | POA: Diagnosis not present

## 2020-04-01 DIAGNOSIS — F0390 Unspecified dementia without behavioral disturbance: Secondary | ICD-10-CM | POA: Diagnosis not present

## 2020-04-03 DIAGNOSIS — E119 Type 2 diabetes mellitus without complications: Secondary | ICD-10-CM | POA: Diagnosis not present

## 2020-04-03 DIAGNOSIS — I10 Essential (primary) hypertension: Secondary | ICD-10-CM | POA: Diagnosis not present

## 2020-04-03 DIAGNOSIS — F0390 Unspecified dementia without behavioral disturbance: Secondary | ICD-10-CM | POA: Diagnosis not present

## 2020-04-03 DIAGNOSIS — Z87891 Personal history of nicotine dependence: Secondary | ICD-10-CM | POA: Diagnosis not present

## 2020-04-03 DIAGNOSIS — M25512 Pain in left shoulder: Secondary | ICD-10-CM | POA: Diagnosis not present

## 2020-04-03 DIAGNOSIS — F419 Anxiety disorder, unspecified: Secondary | ICD-10-CM | POA: Diagnosis not present

## 2020-04-05 DIAGNOSIS — Z87891 Personal history of nicotine dependence: Secondary | ICD-10-CM | POA: Diagnosis not present

## 2020-04-05 DIAGNOSIS — M25512 Pain in left shoulder: Secondary | ICD-10-CM | POA: Diagnosis not present

## 2020-04-05 DIAGNOSIS — F419 Anxiety disorder, unspecified: Secondary | ICD-10-CM | POA: Diagnosis not present

## 2020-04-05 DIAGNOSIS — E119 Type 2 diabetes mellitus without complications: Secondary | ICD-10-CM | POA: Diagnosis not present

## 2020-04-05 DIAGNOSIS — F0390 Unspecified dementia without behavioral disturbance: Secondary | ICD-10-CM | POA: Diagnosis not present

## 2020-04-05 DIAGNOSIS — I10 Essential (primary) hypertension: Secondary | ICD-10-CM | POA: Diagnosis not present

## 2020-04-07 DIAGNOSIS — M25512 Pain in left shoulder: Secondary | ICD-10-CM | POA: Diagnosis not present

## 2020-04-07 DIAGNOSIS — F419 Anxiety disorder, unspecified: Secondary | ICD-10-CM | POA: Diagnosis not present

## 2020-04-07 DIAGNOSIS — I10 Essential (primary) hypertension: Secondary | ICD-10-CM | POA: Diagnosis not present

## 2020-04-07 DIAGNOSIS — E119 Type 2 diabetes mellitus without complications: Secondary | ICD-10-CM | POA: Diagnosis not present

## 2020-04-07 DIAGNOSIS — F0390 Unspecified dementia without behavioral disturbance: Secondary | ICD-10-CM | POA: Diagnosis not present

## 2020-04-07 DIAGNOSIS — Z87891 Personal history of nicotine dependence: Secondary | ICD-10-CM | POA: Diagnosis not present

## 2020-04-08 DIAGNOSIS — F0390 Unspecified dementia without behavioral disturbance: Secondary | ICD-10-CM | POA: Diagnosis not present

## 2020-04-08 DIAGNOSIS — F419 Anxiety disorder, unspecified: Secondary | ICD-10-CM | POA: Diagnosis not present

## 2020-04-08 DIAGNOSIS — I10 Essential (primary) hypertension: Secondary | ICD-10-CM | POA: Diagnosis not present

## 2020-04-08 DIAGNOSIS — E119 Type 2 diabetes mellitus without complications: Secondary | ICD-10-CM | POA: Diagnosis not present

## 2020-04-08 DIAGNOSIS — M25512 Pain in left shoulder: Secondary | ICD-10-CM | POA: Diagnosis not present

## 2020-04-08 DIAGNOSIS — Z87891 Personal history of nicotine dependence: Secondary | ICD-10-CM | POA: Diagnosis not present

## 2020-04-09 DIAGNOSIS — I10 Essential (primary) hypertension: Secondary | ICD-10-CM | POA: Diagnosis not present

## 2020-04-09 DIAGNOSIS — E119 Type 2 diabetes mellitus without complications: Secondary | ICD-10-CM | POA: Diagnosis not present

## 2020-04-09 DIAGNOSIS — M25512 Pain in left shoulder: Secondary | ICD-10-CM | POA: Diagnosis not present

## 2020-04-09 DIAGNOSIS — Z87891 Personal history of nicotine dependence: Secondary | ICD-10-CM | POA: Diagnosis not present

## 2020-04-09 DIAGNOSIS — F0390 Unspecified dementia without behavioral disturbance: Secondary | ICD-10-CM | POA: Diagnosis not present

## 2020-04-09 DIAGNOSIS — F419 Anxiety disorder, unspecified: Secondary | ICD-10-CM | POA: Diagnosis not present

## 2020-04-10 DIAGNOSIS — M25512 Pain in left shoulder: Secondary | ICD-10-CM | POA: Diagnosis not present

## 2020-04-10 DIAGNOSIS — F419 Anxiety disorder, unspecified: Secondary | ICD-10-CM | POA: Diagnosis not present

## 2020-04-10 DIAGNOSIS — I10 Essential (primary) hypertension: Secondary | ICD-10-CM | POA: Diagnosis not present

## 2020-04-10 DIAGNOSIS — E119 Type 2 diabetes mellitus without complications: Secondary | ICD-10-CM | POA: Diagnosis not present

## 2020-04-10 DIAGNOSIS — Z87891 Personal history of nicotine dependence: Secondary | ICD-10-CM | POA: Diagnosis not present

## 2020-04-10 DIAGNOSIS — F0390 Unspecified dementia without behavioral disturbance: Secondary | ICD-10-CM | POA: Diagnosis not present

## 2020-04-11 DIAGNOSIS — E119 Type 2 diabetes mellitus without complications: Secondary | ICD-10-CM | POA: Diagnosis not present

## 2020-04-11 DIAGNOSIS — F0390 Unspecified dementia without behavioral disturbance: Secondary | ICD-10-CM | POA: Diagnosis not present

## 2020-04-11 DIAGNOSIS — I10 Essential (primary) hypertension: Secondary | ICD-10-CM | POA: Diagnosis not present

## 2020-04-11 DIAGNOSIS — Z87891 Personal history of nicotine dependence: Secondary | ICD-10-CM | POA: Diagnosis not present

## 2020-04-11 DIAGNOSIS — F419 Anxiety disorder, unspecified: Secondary | ICD-10-CM | POA: Diagnosis not present

## 2020-04-11 DIAGNOSIS — M25512 Pain in left shoulder: Secondary | ICD-10-CM | POA: Diagnosis not present

## 2020-04-11 DIAGNOSIS — N390 Urinary tract infection, site not specified: Secondary | ICD-10-CM | POA: Diagnosis not present

## 2020-04-14 DIAGNOSIS — F419 Anxiety disorder, unspecified: Secondary | ICD-10-CM | POA: Diagnosis not present

## 2020-04-14 DIAGNOSIS — I10 Essential (primary) hypertension: Secondary | ICD-10-CM | POA: Diagnosis not present

## 2020-04-14 DIAGNOSIS — F0390 Unspecified dementia without behavioral disturbance: Secondary | ICD-10-CM | POA: Diagnosis not present

## 2020-04-14 DIAGNOSIS — Z87891 Personal history of nicotine dependence: Secondary | ICD-10-CM | POA: Diagnosis not present

## 2020-04-14 DIAGNOSIS — M25512 Pain in left shoulder: Secondary | ICD-10-CM | POA: Diagnosis not present

## 2020-04-14 DIAGNOSIS — E119 Type 2 diabetes mellitus without complications: Secondary | ICD-10-CM | POA: Diagnosis not present

## 2020-04-15 DIAGNOSIS — F419 Anxiety disorder, unspecified: Secondary | ICD-10-CM | POA: Diagnosis not present

## 2020-04-15 DIAGNOSIS — M25512 Pain in left shoulder: Secondary | ICD-10-CM | POA: Diagnosis not present

## 2020-04-15 DIAGNOSIS — F0390 Unspecified dementia without behavioral disturbance: Secondary | ICD-10-CM | POA: Diagnosis not present

## 2020-04-15 DIAGNOSIS — Z87891 Personal history of nicotine dependence: Secondary | ICD-10-CM | POA: Diagnosis not present

## 2020-04-15 DIAGNOSIS — I10 Essential (primary) hypertension: Secondary | ICD-10-CM | POA: Diagnosis not present

## 2020-04-15 DIAGNOSIS — E119 Type 2 diabetes mellitus without complications: Secondary | ICD-10-CM | POA: Diagnosis not present

## 2020-04-16 DIAGNOSIS — F0390 Unspecified dementia without behavioral disturbance: Secondary | ICD-10-CM | POA: Diagnosis not present

## 2020-04-16 DIAGNOSIS — M25512 Pain in left shoulder: Secondary | ICD-10-CM | POA: Diagnosis not present

## 2020-04-16 DIAGNOSIS — Z87891 Personal history of nicotine dependence: Secondary | ICD-10-CM | POA: Diagnosis not present

## 2020-04-16 DIAGNOSIS — F419 Anxiety disorder, unspecified: Secondary | ICD-10-CM | POA: Diagnosis not present

## 2020-04-16 DIAGNOSIS — I10 Essential (primary) hypertension: Secondary | ICD-10-CM | POA: Diagnosis not present

## 2020-04-16 DIAGNOSIS — E119 Type 2 diabetes mellitus without complications: Secondary | ICD-10-CM | POA: Diagnosis not present

## 2020-04-17 DIAGNOSIS — E119 Type 2 diabetes mellitus without complications: Secondary | ICD-10-CM | POA: Diagnosis not present

## 2020-04-17 DIAGNOSIS — Z87891 Personal history of nicotine dependence: Secondary | ICD-10-CM | POA: Diagnosis not present

## 2020-04-17 DIAGNOSIS — I10 Essential (primary) hypertension: Secondary | ICD-10-CM | POA: Diagnosis not present

## 2020-04-17 DIAGNOSIS — F419 Anxiety disorder, unspecified: Secondary | ICD-10-CM | POA: Diagnosis not present

## 2020-04-17 DIAGNOSIS — M25512 Pain in left shoulder: Secondary | ICD-10-CM | POA: Diagnosis not present

## 2020-04-17 DIAGNOSIS — F0390 Unspecified dementia without behavioral disturbance: Secondary | ICD-10-CM | POA: Diagnosis not present

## 2020-04-18 ENCOUNTER — Ambulatory Visit: Payer: Medicare PPO | Admitting: Neurology

## 2020-04-18 DIAGNOSIS — E119 Type 2 diabetes mellitus without complications: Secondary | ICD-10-CM | POA: Diagnosis not present

## 2020-04-18 DIAGNOSIS — F419 Anxiety disorder, unspecified: Secondary | ICD-10-CM | POA: Diagnosis not present

## 2020-04-18 DIAGNOSIS — F0390 Unspecified dementia without behavioral disturbance: Secondary | ICD-10-CM | POA: Diagnosis not present

## 2020-04-18 DIAGNOSIS — M25512 Pain in left shoulder: Secondary | ICD-10-CM | POA: Diagnosis not present

## 2020-04-18 DIAGNOSIS — Z87891 Personal history of nicotine dependence: Secondary | ICD-10-CM | POA: Diagnosis not present

## 2020-04-18 DIAGNOSIS — I10 Essential (primary) hypertension: Secondary | ICD-10-CM | POA: Diagnosis not present

## 2020-04-21 DIAGNOSIS — Z87891 Personal history of nicotine dependence: Secondary | ICD-10-CM | POA: Diagnosis not present

## 2020-04-21 DIAGNOSIS — F0390 Unspecified dementia without behavioral disturbance: Secondary | ICD-10-CM | POA: Diagnosis not present

## 2020-04-21 DIAGNOSIS — F419 Anxiety disorder, unspecified: Secondary | ICD-10-CM | POA: Diagnosis not present

## 2020-04-21 DIAGNOSIS — E119 Type 2 diabetes mellitus without complications: Secondary | ICD-10-CM | POA: Diagnosis not present

## 2020-04-21 DIAGNOSIS — M25512 Pain in left shoulder: Secondary | ICD-10-CM | POA: Diagnosis not present

## 2020-04-21 DIAGNOSIS — I10 Essential (primary) hypertension: Secondary | ICD-10-CM | POA: Diagnosis not present

## 2020-04-22 DIAGNOSIS — F0390 Unspecified dementia without behavioral disturbance: Secondary | ICD-10-CM | POA: Diagnosis not present

## 2020-04-22 DIAGNOSIS — F329 Major depressive disorder, single episode, unspecified: Secondary | ICD-10-CM | POA: Diagnosis not present

## 2020-04-22 DIAGNOSIS — F419 Anxiety disorder, unspecified: Secondary | ICD-10-CM | POA: Diagnosis not present

## 2020-04-22 DIAGNOSIS — Z87891 Personal history of nicotine dependence: Secondary | ICD-10-CM | POA: Diagnosis not present

## 2020-04-22 DIAGNOSIS — R5383 Other fatigue: Secondary | ICD-10-CM | POA: Diagnosis not present

## 2020-04-22 DIAGNOSIS — M25512 Pain in left shoulder: Secondary | ICD-10-CM | POA: Diagnosis not present

## 2020-04-22 DIAGNOSIS — I10 Essential (primary) hypertension: Secondary | ICD-10-CM | POA: Diagnosis not present

## 2020-04-22 DIAGNOSIS — E039 Hypothyroidism, unspecified: Secondary | ICD-10-CM | POA: Diagnosis not present

## 2020-04-22 DIAGNOSIS — G20 Parkinson's disease: Secondary | ICD-10-CM | POA: Diagnosis not present

## 2020-04-22 DIAGNOSIS — E119 Type 2 diabetes mellitus without complications: Secondary | ICD-10-CM | POA: Diagnosis not present

## 2020-04-23 DIAGNOSIS — Z87891 Personal history of nicotine dependence: Secondary | ICD-10-CM | POA: Diagnosis not present

## 2020-04-23 DIAGNOSIS — D509 Iron deficiency anemia, unspecified: Secondary | ICD-10-CM | POA: Diagnosis not present

## 2020-04-23 DIAGNOSIS — F0390 Unspecified dementia without behavioral disturbance: Secondary | ICD-10-CM | POA: Diagnosis not present

## 2020-04-23 DIAGNOSIS — I10 Essential (primary) hypertension: Secondary | ICD-10-CM | POA: Diagnosis not present

## 2020-04-23 DIAGNOSIS — E119 Type 2 diabetes mellitus without complications: Secondary | ICD-10-CM | POA: Diagnosis not present

## 2020-04-23 DIAGNOSIS — F419 Anxiety disorder, unspecified: Secondary | ICD-10-CM | POA: Diagnosis not present

## 2020-04-23 DIAGNOSIS — D519 Vitamin B12 deficiency anemia, unspecified: Secondary | ICD-10-CM | POA: Diagnosis not present

## 2020-04-23 DIAGNOSIS — M25512 Pain in left shoulder: Secondary | ICD-10-CM | POA: Diagnosis not present

## 2020-04-23 DIAGNOSIS — E559 Vitamin D deficiency, unspecified: Secondary | ICD-10-CM | POA: Diagnosis not present

## 2020-04-24 DIAGNOSIS — E119 Type 2 diabetes mellitus without complications: Secondary | ICD-10-CM | POA: Diagnosis not present

## 2020-04-24 DIAGNOSIS — M25512 Pain in left shoulder: Secondary | ICD-10-CM | POA: Diagnosis not present

## 2020-04-24 DIAGNOSIS — F419 Anxiety disorder, unspecified: Secondary | ICD-10-CM | POA: Diagnosis not present

## 2020-04-24 DIAGNOSIS — F0390 Unspecified dementia without behavioral disturbance: Secondary | ICD-10-CM | POA: Diagnosis not present

## 2020-04-24 DIAGNOSIS — I10 Essential (primary) hypertension: Secondary | ICD-10-CM | POA: Diagnosis not present

## 2020-04-24 DIAGNOSIS — Z87891 Personal history of nicotine dependence: Secondary | ICD-10-CM | POA: Diagnosis not present

## 2020-04-25 DIAGNOSIS — E119 Type 2 diabetes mellitus without complications: Secondary | ICD-10-CM | POA: Diagnosis not present

## 2020-04-25 DIAGNOSIS — Z87891 Personal history of nicotine dependence: Secondary | ICD-10-CM | POA: Diagnosis not present

## 2020-04-25 DIAGNOSIS — M25512 Pain in left shoulder: Secondary | ICD-10-CM | POA: Diagnosis not present

## 2020-04-25 DIAGNOSIS — I10 Essential (primary) hypertension: Secondary | ICD-10-CM | POA: Diagnosis not present

## 2020-04-25 DIAGNOSIS — F419 Anxiety disorder, unspecified: Secondary | ICD-10-CM | POA: Diagnosis not present

## 2020-04-25 DIAGNOSIS — F0390 Unspecified dementia without behavioral disturbance: Secondary | ICD-10-CM | POA: Diagnosis not present

## 2020-04-28 DIAGNOSIS — M25512 Pain in left shoulder: Secondary | ICD-10-CM | POA: Diagnosis not present

## 2020-04-28 DIAGNOSIS — I10 Essential (primary) hypertension: Secondary | ICD-10-CM | POA: Diagnosis not present

## 2020-04-28 DIAGNOSIS — E119 Type 2 diabetes mellitus without complications: Secondary | ICD-10-CM | POA: Diagnosis not present

## 2020-04-28 DIAGNOSIS — F0390 Unspecified dementia without behavioral disturbance: Secondary | ICD-10-CM | POA: Diagnosis not present

## 2020-04-28 DIAGNOSIS — Z87891 Personal history of nicotine dependence: Secondary | ICD-10-CM | POA: Diagnosis not present

## 2020-04-28 DIAGNOSIS — F419 Anxiety disorder, unspecified: Secondary | ICD-10-CM | POA: Diagnosis not present

## 2020-04-30 DIAGNOSIS — F419 Anxiety disorder, unspecified: Secondary | ICD-10-CM | POA: Diagnosis not present

## 2020-04-30 DIAGNOSIS — Z87891 Personal history of nicotine dependence: Secondary | ICD-10-CM | POA: Diagnosis not present

## 2020-04-30 DIAGNOSIS — E119 Type 2 diabetes mellitus without complications: Secondary | ICD-10-CM | POA: Diagnosis not present

## 2020-04-30 DIAGNOSIS — M25512 Pain in left shoulder: Secondary | ICD-10-CM | POA: Diagnosis not present

## 2020-04-30 DIAGNOSIS — I10 Essential (primary) hypertension: Secondary | ICD-10-CM | POA: Diagnosis not present

## 2020-04-30 DIAGNOSIS — F0390 Unspecified dementia without behavioral disturbance: Secondary | ICD-10-CM | POA: Diagnosis not present

## 2020-05-02 DIAGNOSIS — E559 Vitamin D deficiency, unspecified: Secondary | ICD-10-CM | POA: Diagnosis not present

## 2020-05-02 DIAGNOSIS — F0281 Dementia in other diseases classified elsewhere with behavioral disturbance: Secondary | ICD-10-CM | POA: Diagnosis not present

## 2020-05-02 DIAGNOSIS — F331 Major depressive disorder, recurrent, moderate: Secondary | ICD-10-CM | POA: Diagnosis not present

## 2020-05-02 DIAGNOSIS — F0390 Unspecified dementia without behavioral disturbance: Secondary | ICD-10-CM | POA: Diagnosis not present

## 2020-05-02 DIAGNOSIS — R4689 Other symptoms and signs involving appearance and behavior: Secondary | ICD-10-CM | POA: Diagnosis not present

## 2020-05-02 DIAGNOSIS — I10 Essential (primary) hypertension: Secondary | ICD-10-CM | POA: Diagnosis not present

## 2020-05-02 DIAGNOSIS — Z9181 History of falling: Secondary | ICD-10-CM | POA: Diagnosis not present

## 2020-05-02 DIAGNOSIS — F419 Anxiety disorder, unspecified: Secondary | ICD-10-CM | POA: Diagnosis not present

## 2020-05-02 DIAGNOSIS — R5383 Other fatigue: Secondary | ICD-10-CM | POA: Diagnosis not present

## 2020-05-02 DIAGNOSIS — R45851 Suicidal ideations: Secondary | ICD-10-CM | POA: Diagnosis not present

## 2020-05-02 DIAGNOSIS — E119 Type 2 diabetes mellitus without complications: Secondary | ICD-10-CM | POA: Diagnosis not present

## 2020-05-02 DIAGNOSIS — M25512 Pain in left shoulder: Secondary | ICD-10-CM | POA: Diagnosis not present

## 2020-05-02 DIAGNOSIS — G20 Parkinson's disease: Secondary | ICD-10-CM | POA: Diagnosis not present

## 2020-05-02 DIAGNOSIS — Z9183 Wandering in diseases classified elsewhere: Secondary | ICD-10-CM | POA: Diagnosis not present

## 2020-05-02 DIAGNOSIS — Z87891 Personal history of nicotine dependence: Secondary | ICD-10-CM | POA: Diagnosis not present

## 2020-05-02 DIAGNOSIS — Z79899 Other long term (current) drug therapy: Secondary | ICD-10-CM | POA: Diagnosis not present

## 2020-05-05 DIAGNOSIS — I10 Essential (primary) hypertension: Secondary | ICD-10-CM | POA: Diagnosis not present

## 2020-05-05 DIAGNOSIS — F0390 Unspecified dementia without behavioral disturbance: Secondary | ICD-10-CM | POA: Diagnosis not present

## 2020-05-05 DIAGNOSIS — F419 Anxiety disorder, unspecified: Secondary | ICD-10-CM | POA: Diagnosis not present

## 2020-05-05 DIAGNOSIS — E119 Type 2 diabetes mellitus without complications: Secondary | ICD-10-CM | POA: Diagnosis not present

## 2020-05-05 DIAGNOSIS — Z87891 Personal history of nicotine dependence: Secondary | ICD-10-CM | POA: Diagnosis not present

## 2020-05-05 DIAGNOSIS — M25512 Pain in left shoulder: Secondary | ICD-10-CM | POA: Diagnosis not present

## 2020-05-06 DIAGNOSIS — Z87891 Personal history of nicotine dependence: Secondary | ICD-10-CM | POA: Diagnosis not present

## 2020-05-06 DIAGNOSIS — I10 Essential (primary) hypertension: Secondary | ICD-10-CM | POA: Diagnosis not present

## 2020-05-06 DIAGNOSIS — M25512 Pain in left shoulder: Secondary | ICD-10-CM | POA: Diagnosis not present

## 2020-05-06 DIAGNOSIS — F0390 Unspecified dementia without behavioral disturbance: Secondary | ICD-10-CM | POA: Diagnosis not present

## 2020-05-06 DIAGNOSIS — F419 Anxiety disorder, unspecified: Secondary | ICD-10-CM | POA: Diagnosis not present

## 2020-05-06 DIAGNOSIS — E119 Type 2 diabetes mellitus without complications: Secondary | ICD-10-CM | POA: Diagnosis not present

## 2020-05-08 DIAGNOSIS — F0390 Unspecified dementia without behavioral disturbance: Secondary | ICD-10-CM | POA: Diagnosis not present

## 2020-05-08 DIAGNOSIS — E119 Type 2 diabetes mellitus without complications: Secondary | ICD-10-CM | POA: Diagnosis not present

## 2020-05-08 DIAGNOSIS — F419 Anxiety disorder, unspecified: Secondary | ICD-10-CM | POA: Diagnosis not present

## 2020-05-08 DIAGNOSIS — M25512 Pain in left shoulder: Secondary | ICD-10-CM | POA: Diagnosis not present

## 2020-05-08 DIAGNOSIS — I10 Essential (primary) hypertension: Secondary | ICD-10-CM | POA: Diagnosis not present

## 2020-05-08 DIAGNOSIS — Z87891 Personal history of nicotine dependence: Secondary | ICD-10-CM | POA: Diagnosis not present

## 2020-05-12 DIAGNOSIS — M25512 Pain in left shoulder: Secondary | ICD-10-CM | POA: Diagnosis not present

## 2020-05-12 DIAGNOSIS — F0390 Unspecified dementia without behavioral disturbance: Secondary | ICD-10-CM | POA: Diagnosis not present

## 2020-05-12 DIAGNOSIS — I10 Essential (primary) hypertension: Secondary | ICD-10-CM | POA: Diagnosis not present

## 2020-05-12 DIAGNOSIS — Z87891 Personal history of nicotine dependence: Secondary | ICD-10-CM | POA: Diagnosis not present

## 2020-05-12 DIAGNOSIS — F419 Anxiety disorder, unspecified: Secondary | ICD-10-CM | POA: Diagnosis not present

## 2020-05-12 DIAGNOSIS — E119 Type 2 diabetes mellitus without complications: Secondary | ICD-10-CM | POA: Diagnosis not present

## 2020-05-13 DIAGNOSIS — I10 Essential (primary) hypertension: Secondary | ICD-10-CM | POA: Diagnosis not present

## 2020-05-13 DIAGNOSIS — F419 Anxiety disorder, unspecified: Secondary | ICD-10-CM | POA: Diagnosis not present

## 2020-05-13 DIAGNOSIS — E119 Type 2 diabetes mellitus without complications: Secondary | ICD-10-CM | POA: Diagnosis not present

## 2020-05-13 DIAGNOSIS — M25512 Pain in left shoulder: Secondary | ICD-10-CM | POA: Diagnosis not present

## 2020-05-13 DIAGNOSIS — F0390 Unspecified dementia without behavioral disturbance: Secondary | ICD-10-CM | POA: Diagnosis not present

## 2020-05-13 DIAGNOSIS — Z87891 Personal history of nicotine dependence: Secondary | ICD-10-CM | POA: Diagnosis not present

## 2020-05-16 DIAGNOSIS — M25512 Pain in left shoulder: Secondary | ICD-10-CM | POA: Diagnosis not present

## 2020-05-16 DIAGNOSIS — G20 Parkinson's disease: Secondary | ICD-10-CM | POA: Diagnosis not present

## 2020-05-16 DIAGNOSIS — I10 Essential (primary) hypertension: Secondary | ICD-10-CM | POA: Diagnosis not present

## 2020-05-16 DIAGNOSIS — E119 Type 2 diabetes mellitus without complications: Secondary | ICD-10-CM | POA: Diagnosis not present

## 2020-05-16 DIAGNOSIS — Z87891 Personal history of nicotine dependence: Secondary | ICD-10-CM | POA: Diagnosis not present

## 2020-05-16 DIAGNOSIS — F0390 Unspecified dementia without behavioral disturbance: Secondary | ICD-10-CM | POA: Diagnosis not present

## 2020-05-16 DIAGNOSIS — R2681 Unsteadiness on feet: Secondary | ICD-10-CM | POA: Diagnosis not present

## 2020-05-16 DIAGNOSIS — F419 Anxiety disorder, unspecified: Secondary | ICD-10-CM | POA: Diagnosis not present

## 2020-05-19 DIAGNOSIS — E119 Type 2 diabetes mellitus without complications: Secondary | ICD-10-CM | POA: Diagnosis not present

## 2020-05-19 DIAGNOSIS — M25512 Pain in left shoulder: Secondary | ICD-10-CM | POA: Diagnosis not present

## 2020-05-19 DIAGNOSIS — Z87891 Personal history of nicotine dependence: Secondary | ICD-10-CM | POA: Diagnosis not present

## 2020-05-19 DIAGNOSIS — F0390 Unspecified dementia without behavioral disturbance: Secondary | ICD-10-CM | POA: Diagnosis not present

## 2020-05-19 DIAGNOSIS — I10 Essential (primary) hypertension: Secondary | ICD-10-CM | POA: Diagnosis not present

## 2020-05-19 DIAGNOSIS — F419 Anxiety disorder, unspecified: Secondary | ICD-10-CM | POA: Diagnosis not present

## 2020-05-20 DIAGNOSIS — F419 Anxiety disorder, unspecified: Secondary | ICD-10-CM | POA: Diagnosis not present

## 2020-05-20 DIAGNOSIS — F0390 Unspecified dementia without behavioral disturbance: Secondary | ICD-10-CM | POA: Diagnosis not present

## 2020-05-20 DIAGNOSIS — G20 Parkinson's disease: Secondary | ICD-10-CM | POA: Diagnosis not present

## 2020-05-20 DIAGNOSIS — I10 Essential (primary) hypertension: Secondary | ICD-10-CM | POA: Diagnosis not present

## 2020-05-22 DIAGNOSIS — F0390 Unspecified dementia without behavioral disturbance: Secondary | ICD-10-CM | POA: Diagnosis not present

## 2020-05-22 DIAGNOSIS — M25512 Pain in left shoulder: Secondary | ICD-10-CM | POA: Diagnosis not present

## 2020-05-22 DIAGNOSIS — I10 Essential (primary) hypertension: Secondary | ICD-10-CM | POA: Diagnosis not present

## 2020-05-22 DIAGNOSIS — F419 Anxiety disorder, unspecified: Secondary | ICD-10-CM | POA: Diagnosis not present

## 2020-05-22 DIAGNOSIS — Z87891 Personal history of nicotine dependence: Secondary | ICD-10-CM | POA: Diagnosis not present

## 2020-05-22 DIAGNOSIS — E119 Type 2 diabetes mellitus without complications: Secondary | ICD-10-CM | POA: Diagnosis not present

## 2020-05-23 DIAGNOSIS — Z87891 Personal history of nicotine dependence: Secondary | ICD-10-CM | POA: Diagnosis not present

## 2020-05-23 DIAGNOSIS — F0390 Unspecified dementia without behavioral disturbance: Secondary | ICD-10-CM | POA: Diagnosis not present

## 2020-05-23 DIAGNOSIS — E119 Type 2 diabetes mellitus without complications: Secondary | ICD-10-CM | POA: Diagnosis not present

## 2020-05-23 DIAGNOSIS — I10 Essential (primary) hypertension: Secondary | ICD-10-CM | POA: Diagnosis not present

## 2020-05-23 DIAGNOSIS — M25512 Pain in left shoulder: Secondary | ICD-10-CM | POA: Diagnosis not present

## 2020-05-23 DIAGNOSIS — F419 Anxiety disorder, unspecified: Secondary | ICD-10-CM | POA: Diagnosis not present

## 2020-05-26 DIAGNOSIS — E119 Type 2 diabetes mellitus without complications: Secondary | ICD-10-CM | POA: Diagnosis not present

## 2020-05-26 DIAGNOSIS — Z87891 Personal history of nicotine dependence: Secondary | ICD-10-CM | POA: Diagnosis not present

## 2020-05-26 DIAGNOSIS — M25512 Pain in left shoulder: Secondary | ICD-10-CM | POA: Diagnosis not present

## 2020-05-26 DIAGNOSIS — F0390 Unspecified dementia without behavioral disturbance: Secondary | ICD-10-CM | POA: Diagnosis not present

## 2020-05-26 DIAGNOSIS — F419 Anxiety disorder, unspecified: Secondary | ICD-10-CM | POA: Diagnosis not present

## 2020-05-26 DIAGNOSIS — I10 Essential (primary) hypertension: Secondary | ICD-10-CM | POA: Diagnosis not present

## 2020-05-28 DIAGNOSIS — M25512 Pain in left shoulder: Secondary | ICD-10-CM | POA: Diagnosis not present

## 2020-05-28 DIAGNOSIS — I10 Essential (primary) hypertension: Secondary | ICD-10-CM | POA: Diagnosis not present

## 2020-05-28 DIAGNOSIS — E119 Type 2 diabetes mellitus without complications: Secondary | ICD-10-CM | POA: Diagnosis not present

## 2020-05-28 DIAGNOSIS — Z87891 Personal history of nicotine dependence: Secondary | ICD-10-CM | POA: Diagnosis not present

## 2020-05-28 DIAGNOSIS — F0390 Unspecified dementia without behavioral disturbance: Secondary | ICD-10-CM | POA: Diagnosis not present

## 2020-05-28 DIAGNOSIS — F419 Anxiety disorder, unspecified: Secondary | ICD-10-CM | POA: Diagnosis not present

## 2020-05-29 DIAGNOSIS — I10 Essential (primary) hypertension: Secondary | ICD-10-CM | POA: Diagnosis not present

## 2020-05-29 DIAGNOSIS — F419 Anxiety disorder, unspecified: Secondary | ICD-10-CM | POA: Diagnosis not present

## 2020-05-29 DIAGNOSIS — E119 Type 2 diabetes mellitus without complications: Secondary | ICD-10-CM | POA: Diagnosis not present

## 2020-05-29 DIAGNOSIS — Z87891 Personal history of nicotine dependence: Secondary | ICD-10-CM | POA: Diagnosis not present

## 2020-05-29 DIAGNOSIS — M25512 Pain in left shoulder: Secondary | ICD-10-CM | POA: Diagnosis not present

## 2020-05-29 DIAGNOSIS — F0390 Unspecified dementia without behavioral disturbance: Secondary | ICD-10-CM | POA: Diagnosis not present

## 2020-06-02 DIAGNOSIS — Z87891 Personal history of nicotine dependence: Secondary | ICD-10-CM | POA: Diagnosis not present

## 2020-06-02 DIAGNOSIS — E119 Type 2 diabetes mellitus without complications: Secondary | ICD-10-CM | POA: Diagnosis not present

## 2020-06-02 DIAGNOSIS — F0390 Unspecified dementia without behavioral disturbance: Secondary | ICD-10-CM | POA: Diagnosis not present

## 2020-06-02 DIAGNOSIS — F419 Anxiety disorder, unspecified: Secondary | ICD-10-CM | POA: Diagnosis not present

## 2020-06-02 DIAGNOSIS — M25512 Pain in left shoulder: Secondary | ICD-10-CM | POA: Diagnosis not present

## 2020-06-02 DIAGNOSIS — I10 Essential (primary) hypertension: Secondary | ICD-10-CM | POA: Diagnosis not present

## 2020-06-04 DIAGNOSIS — F0390 Unspecified dementia without behavioral disturbance: Secondary | ICD-10-CM | POA: Diagnosis not present

## 2020-06-04 DIAGNOSIS — I10 Essential (primary) hypertension: Secondary | ICD-10-CM | POA: Diagnosis not present

## 2020-06-04 DIAGNOSIS — Z87891 Personal history of nicotine dependence: Secondary | ICD-10-CM | POA: Diagnosis not present

## 2020-06-04 DIAGNOSIS — F419 Anxiety disorder, unspecified: Secondary | ICD-10-CM | POA: Diagnosis not present

## 2020-06-04 DIAGNOSIS — E119 Type 2 diabetes mellitus without complications: Secondary | ICD-10-CM | POA: Diagnosis not present

## 2020-06-04 DIAGNOSIS — M25512 Pain in left shoulder: Secondary | ICD-10-CM | POA: Diagnosis not present

## 2020-06-05 DIAGNOSIS — Z87891 Personal history of nicotine dependence: Secondary | ICD-10-CM | POA: Diagnosis not present

## 2020-06-05 DIAGNOSIS — E119 Type 2 diabetes mellitus without complications: Secondary | ICD-10-CM | POA: Diagnosis not present

## 2020-06-05 DIAGNOSIS — F419 Anxiety disorder, unspecified: Secondary | ICD-10-CM | POA: Diagnosis not present

## 2020-06-05 DIAGNOSIS — M25512 Pain in left shoulder: Secondary | ICD-10-CM | POA: Diagnosis not present

## 2020-06-05 DIAGNOSIS — F0390 Unspecified dementia without behavioral disturbance: Secondary | ICD-10-CM | POA: Diagnosis not present

## 2020-06-05 DIAGNOSIS — I10 Essential (primary) hypertension: Secondary | ICD-10-CM | POA: Diagnosis not present

## 2020-06-06 DIAGNOSIS — R079 Chest pain, unspecified: Secondary | ICD-10-CM | POA: Diagnosis not present

## 2020-06-06 DIAGNOSIS — R9431 Abnormal electrocardiogram [ECG] [EKG]: Secondary | ICD-10-CM | POA: Diagnosis not present

## 2020-06-09 DIAGNOSIS — F0390 Unspecified dementia without behavioral disturbance: Secondary | ICD-10-CM | POA: Diagnosis not present

## 2020-06-09 DIAGNOSIS — F419 Anxiety disorder, unspecified: Secondary | ICD-10-CM | POA: Diagnosis not present

## 2020-06-09 DIAGNOSIS — M25512 Pain in left shoulder: Secondary | ICD-10-CM | POA: Diagnosis not present

## 2020-06-09 DIAGNOSIS — I10 Essential (primary) hypertension: Secondary | ICD-10-CM | POA: Diagnosis not present

## 2020-06-09 DIAGNOSIS — Z87891 Personal history of nicotine dependence: Secondary | ICD-10-CM | POA: Diagnosis not present

## 2020-06-09 DIAGNOSIS — E119 Type 2 diabetes mellitus without complications: Secondary | ICD-10-CM | POA: Diagnosis not present

## 2020-06-10 DIAGNOSIS — F0390 Unspecified dementia without behavioral disturbance: Secondary | ICD-10-CM | POA: Diagnosis not present

## 2020-06-10 DIAGNOSIS — I10 Essential (primary) hypertension: Secondary | ICD-10-CM | POA: Diagnosis not present

## 2020-06-10 DIAGNOSIS — E119 Type 2 diabetes mellitus without complications: Secondary | ICD-10-CM | POA: Diagnosis not present

## 2020-06-10 DIAGNOSIS — F419 Anxiety disorder, unspecified: Secondary | ICD-10-CM | POA: Diagnosis not present

## 2020-06-10 DIAGNOSIS — Z87891 Personal history of nicotine dependence: Secondary | ICD-10-CM | POA: Diagnosis not present

## 2020-06-10 DIAGNOSIS — M25512 Pain in left shoulder: Secondary | ICD-10-CM | POA: Diagnosis not present

## 2020-06-12 DIAGNOSIS — F0390 Unspecified dementia without behavioral disturbance: Secondary | ICD-10-CM | POA: Diagnosis not present

## 2020-06-12 DIAGNOSIS — E119 Type 2 diabetes mellitus without complications: Secondary | ICD-10-CM | POA: Diagnosis not present

## 2020-06-12 DIAGNOSIS — I10 Essential (primary) hypertension: Secondary | ICD-10-CM | POA: Diagnosis not present

## 2020-06-12 DIAGNOSIS — M25512 Pain in left shoulder: Secondary | ICD-10-CM | POA: Diagnosis not present

## 2020-06-12 DIAGNOSIS — Z87891 Personal history of nicotine dependence: Secondary | ICD-10-CM | POA: Diagnosis not present

## 2020-06-12 DIAGNOSIS — F419 Anxiety disorder, unspecified: Secondary | ICD-10-CM | POA: Diagnosis not present

## 2020-06-13 DIAGNOSIS — Z87891 Personal history of nicotine dependence: Secondary | ICD-10-CM | POA: Diagnosis not present

## 2020-06-13 DIAGNOSIS — F0390 Unspecified dementia without behavioral disturbance: Secondary | ICD-10-CM | POA: Diagnosis not present

## 2020-06-13 DIAGNOSIS — I10 Essential (primary) hypertension: Secondary | ICD-10-CM | POA: Diagnosis not present

## 2020-06-13 DIAGNOSIS — F419 Anxiety disorder, unspecified: Secondary | ICD-10-CM | POA: Diagnosis not present

## 2020-06-13 DIAGNOSIS — E119 Type 2 diabetes mellitus without complications: Secondary | ICD-10-CM | POA: Diagnosis not present

## 2020-06-13 DIAGNOSIS — M25512 Pain in left shoulder: Secondary | ICD-10-CM | POA: Diagnosis not present

## 2020-06-16 DIAGNOSIS — M25512 Pain in left shoulder: Secondary | ICD-10-CM | POA: Diagnosis not present

## 2020-06-17 DIAGNOSIS — F419 Anxiety disorder, unspecified: Secondary | ICD-10-CM | POA: Diagnosis not present

## 2020-06-17 DIAGNOSIS — M25512 Pain in left shoulder: Secondary | ICD-10-CM | POA: Diagnosis not present

## 2020-06-17 DIAGNOSIS — I10 Essential (primary) hypertension: Secondary | ICD-10-CM | POA: Diagnosis not present

## 2020-06-17 DIAGNOSIS — Z87891 Personal history of nicotine dependence: Secondary | ICD-10-CM | POA: Diagnosis not present

## 2020-06-17 DIAGNOSIS — E119 Type 2 diabetes mellitus without complications: Secondary | ICD-10-CM | POA: Diagnosis not present

## 2020-06-17 DIAGNOSIS — F0390 Unspecified dementia without behavioral disturbance: Secondary | ICD-10-CM | POA: Diagnosis not present

## 2020-06-19 DIAGNOSIS — I10 Essential (primary) hypertension: Secondary | ICD-10-CM | POA: Diagnosis not present

## 2020-06-19 DIAGNOSIS — F419 Anxiety disorder, unspecified: Secondary | ICD-10-CM | POA: Diagnosis not present

## 2020-06-19 DIAGNOSIS — E119 Type 2 diabetes mellitus without complications: Secondary | ICD-10-CM | POA: Diagnosis not present

## 2020-06-19 DIAGNOSIS — F0390 Unspecified dementia without behavioral disturbance: Secondary | ICD-10-CM | POA: Diagnosis not present

## 2020-06-19 DIAGNOSIS — Z87891 Personal history of nicotine dependence: Secondary | ICD-10-CM | POA: Diagnosis not present

## 2020-06-19 DIAGNOSIS — M25512 Pain in left shoulder: Secondary | ICD-10-CM | POA: Diagnosis not present

## 2020-06-23 DIAGNOSIS — Z87891 Personal history of nicotine dependence: Secondary | ICD-10-CM | POA: Diagnosis not present

## 2020-06-23 DIAGNOSIS — I10 Essential (primary) hypertension: Secondary | ICD-10-CM | POA: Diagnosis not present

## 2020-06-23 DIAGNOSIS — F419 Anxiety disorder, unspecified: Secondary | ICD-10-CM | POA: Diagnosis not present

## 2020-06-23 DIAGNOSIS — F0390 Unspecified dementia without behavioral disturbance: Secondary | ICD-10-CM | POA: Diagnosis not present

## 2020-06-23 DIAGNOSIS — E119 Type 2 diabetes mellitus without complications: Secondary | ICD-10-CM | POA: Diagnosis not present

## 2020-06-23 DIAGNOSIS — M25512 Pain in left shoulder: Secondary | ICD-10-CM | POA: Diagnosis not present

## 2020-06-24 DIAGNOSIS — F419 Anxiety disorder, unspecified: Secondary | ICD-10-CM | POA: Diagnosis not present

## 2020-06-24 DIAGNOSIS — E119 Type 2 diabetes mellitus without complications: Secondary | ICD-10-CM | POA: Diagnosis not present

## 2020-06-24 DIAGNOSIS — Z87891 Personal history of nicotine dependence: Secondary | ICD-10-CM | POA: Diagnosis not present

## 2020-06-24 DIAGNOSIS — F0390 Unspecified dementia without behavioral disturbance: Secondary | ICD-10-CM | POA: Diagnosis not present

## 2020-06-24 DIAGNOSIS — M25512 Pain in left shoulder: Secondary | ICD-10-CM | POA: Diagnosis not present

## 2020-06-24 DIAGNOSIS — I10 Essential (primary) hypertension: Secondary | ICD-10-CM | POA: Diagnosis not present

## 2020-06-27 DIAGNOSIS — Z87891 Personal history of nicotine dependence: Secondary | ICD-10-CM | POA: Diagnosis not present

## 2020-06-27 DIAGNOSIS — I10 Essential (primary) hypertension: Secondary | ICD-10-CM | POA: Diagnosis not present

## 2020-06-27 DIAGNOSIS — E119 Type 2 diabetes mellitus without complications: Secondary | ICD-10-CM | POA: Diagnosis not present

## 2020-06-27 DIAGNOSIS — F0390 Unspecified dementia without behavioral disturbance: Secondary | ICD-10-CM | POA: Diagnosis not present

## 2020-06-27 DIAGNOSIS — M25512 Pain in left shoulder: Secondary | ICD-10-CM | POA: Diagnosis not present

## 2020-06-27 DIAGNOSIS — F419 Anxiety disorder, unspecified: Secondary | ICD-10-CM | POA: Diagnosis not present

## 2020-07-01 DIAGNOSIS — Z87891 Personal history of nicotine dependence: Secondary | ICD-10-CM | POA: Diagnosis not present

## 2020-07-01 DIAGNOSIS — M25512 Pain in left shoulder: Secondary | ICD-10-CM | POA: Diagnosis not present

## 2020-07-01 DIAGNOSIS — I10 Essential (primary) hypertension: Secondary | ICD-10-CM | POA: Diagnosis not present

## 2020-07-01 DIAGNOSIS — F0390 Unspecified dementia without behavioral disturbance: Secondary | ICD-10-CM | POA: Diagnosis not present

## 2020-07-01 DIAGNOSIS — F419 Anxiety disorder, unspecified: Secondary | ICD-10-CM | POA: Diagnosis not present

## 2020-07-01 DIAGNOSIS — E119 Type 2 diabetes mellitus without complications: Secondary | ICD-10-CM | POA: Diagnosis not present

## 2020-07-04 DIAGNOSIS — I10 Essential (primary) hypertension: Secondary | ICD-10-CM | POA: Diagnosis not present

## 2020-07-04 DIAGNOSIS — Z87891 Personal history of nicotine dependence: Secondary | ICD-10-CM | POA: Diagnosis not present

## 2020-07-04 DIAGNOSIS — M25512 Pain in left shoulder: Secondary | ICD-10-CM | POA: Diagnosis not present

## 2020-07-04 DIAGNOSIS — F419 Anxiety disorder, unspecified: Secondary | ICD-10-CM | POA: Diagnosis not present

## 2020-07-04 DIAGNOSIS — F0390 Unspecified dementia without behavioral disturbance: Secondary | ICD-10-CM | POA: Diagnosis not present

## 2020-07-04 DIAGNOSIS — E119 Type 2 diabetes mellitus without complications: Secondary | ICD-10-CM | POA: Diagnosis not present

## 2020-07-08 DIAGNOSIS — Z87891 Personal history of nicotine dependence: Secondary | ICD-10-CM | POA: Diagnosis not present

## 2020-07-08 DIAGNOSIS — F419 Anxiety disorder, unspecified: Secondary | ICD-10-CM | POA: Diagnosis not present

## 2020-07-08 DIAGNOSIS — E119 Type 2 diabetes mellitus without complications: Secondary | ICD-10-CM | POA: Diagnosis not present

## 2020-07-08 DIAGNOSIS — F0390 Unspecified dementia without behavioral disturbance: Secondary | ICD-10-CM | POA: Diagnosis not present

## 2020-07-08 DIAGNOSIS — I10 Essential (primary) hypertension: Secondary | ICD-10-CM | POA: Diagnosis not present

## 2020-07-08 DIAGNOSIS — M25512 Pain in left shoulder: Secondary | ICD-10-CM | POA: Diagnosis not present

## 2020-07-15 DIAGNOSIS — R9431 Abnormal electrocardiogram [ECG] [EKG]: Secondary | ICD-10-CM | POA: Diagnosis not present

## 2020-07-15 DIAGNOSIS — I2510 Atherosclerotic heart disease of native coronary artery without angina pectoris: Secondary | ICD-10-CM | POA: Diagnosis not present

## 2020-07-15 DIAGNOSIS — N189 Chronic kidney disease, unspecified: Secondary | ICD-10-CM | POA: Diagnosis not present

## 2020-07-15 DIAGNOSIS — I10 Essential (primary) hypertension: Secondary | ICD-10-CM | POA: Diagnosis not present

## 2020-07-16 DIAGNOSIS — I10 Essential (primary) hypertension: Secondary | ICD-10-CM | POA: Diagnosis not present

## 2020-07-16 DIAGNOSIS — F419 Anxiety disorder, unspecified: Secondary | ICD-10-CM | POA: Diagnosis not present

## 2020-07-16 DIAGNOSIS — Z87891 Personal history of nicotine dependence: Secondary | ICD-10-CM | POA: Diagnosis not present

## 2020-07-16 DIAGNOSIS — F0390 Unspecified dementia without behavioral disturbance: Secondary | ICD-10-CM | POA: Diagnosis not present

## 2020-07-16 DIAGNOSIS — M25512 Pain in left shoulder: Secondary | ICD-10-CM | POA: Diagnosis not present

## 2020-07-16 DIAGNOSIS — E119 Type 2 diabetes mellitus without complications: Secondary | ICD-10-CM | POA: Diagnosis not present

## 2020-07-18 DIAGNOSIS — I10 Essential (primary) hypertension: Secondary | ICD-10-CM | POA: Diagnosis not present

## 2020-07-18 DIAGNOSIS — G4700 Insomnia, unspecified: Secondary | ICD-10-CM | POA: Diagnosis not present

## 2020-07-18 DIAGNOSIS — G20 Parkinson's disease: Secondary | ICD-10-CM | POA: Diagnosis not present

## 2020-07-18 DIAGNOSIS — W19XXXA Unspecified fall, initial encounter: Secondary | ICD-10-CM | POA: Diagnosis not present

## 2020-07-21 DIAGNOSIS — Z87891 Personal history of nicotine dependence: Secondary | ICD-10-CM | POA: Diagnosis not present

## 2020-07-21 DIAGNOSIS — F0390 Unspecified dementia without behavioral disturbance: Secondary | ICD-10-CM | POA: Diagnosis not present

## 2020-07-21 DIAGNOSIS — E119 Type 2 diabetes mellitus without complications: Secondary | ICD-10-CM | POA: Diagnosis not present

## 2020-07-21 DIAGNOSIS — F419 Anxiety disorder, unspecified: Secondary | ICD-10-CM | POA: Diagnosis not present

## 2020-07-21 DIAGNOSIS — I10 Essential (primary) hypertension: Secondary | ICD-10-CM | POA: Diagnosis not present

## 2020-07-21 DIAGNOSIS — M25512 Pain in left shoulder: Secondary | ICD-10-CM | POA: Diagnosis not present

## 2020-07-25 DIAGNOSIS — G20 Parkinson's disease: Secondary | ICD-10-CM | POA: Diagnosis not present

## 2020-07-25 DIAGNOSIS — I10 Essential (primary) hypertension: Secondary | ICD-10-CM | POA: Diagnosis not present

## 2020-07-25 DIAGNOSIS — G4700 Insomnia, unspecified: Secondary | ICD-10-CM | POA: Diagnosis not present

## 2020-07-25 DIAGNOSIS — B029 Zoster without complications: Secondary | ICD-10-CM | POA: Diagnosis not present

## 2020-08-15 DIAGNOSIS — I10 Essential (primary) hypertension: Secondary | ICD-10-CM | POA: Diagnosis not present

## 2020-08-15 DIAGNOSIS — E559 Vitamin D deficiency, unspecified: Secondary | ICD-10-CM | POA: Diagnosis not present

## 2020-08-15 DIAGNOSIS — G20 Parkinson's disease: Secondary | ICD-10-CM | POA: Diagnosis not present

## 2020-08-18 DIAGNOSIS — I10 Essential (primary) hypertension: Secondary | ICD-10-CM | POA: Diagnosis not present

## 2020-08-18 DIAGNOSIS — E559 Vitamin D deficiency, unspecified: Secondary | ICD-10-CM | POA: Diagnosis not present

## 2020-08-25 DIAGNOSIS — G20 Parkinson's disease: Secondary | ICD-10-CM | POA: Diagnosis not present

## 2020-08-25 DIAGNOSIS — I129 Hypertensive chronic kidney disease with stage 1 through stage 4 chronic kidney disease, or unspecified chronic kidney disease: Secondary | ICD-10-CM | POA: Diagnosis not present

## 2020-08-25 DIAGNOSIS — F419 Anxiety disorder, unspecified: Secondary | ICD-10-CM | POA: Diagnosis not present

## 2020-08-25 DIAGNOSIS — I2510 Atherosclerotic heart disease of native coronary artery without angina pectoris: Secondary | ICD-10-CM | POA: Diagnosis not present

## 2020-08-25 DIAGNOSIS — F0280 Dementia in other diseases classified elsewhere without behavioral disturbance: Secondary | ICD-10-CM | POA: Diagnosis not present

## 2020-08-25 DIAGNOSIS — F32A Depression, unspecified: Secondary | ICD-10-CM | POA: Diagnosis not present

## 2020-08-25 DIAGNOSIS — I10 Essential (primary) hypertension: Secondary | ICD-10-CM | POA: Diagnosis not present

## 2020-08-25 DIAGNOSIS — N189 Chronic kidney disease, unspecified: Secondary | ICD-10-CM | POA: Diagnosis not present

## 2020-08-25 DIAGNOSIS — S42302G Unspecified fracture of shaft of humerus, left arm, subsequent encounter for fracture with delayed healing: Secondary | ICD-10-CM | POA: Diagnosis not present

## 2020-08-25 DIAGNOSIS — E1122 Type 2 diabetes mellitus with diabetic chronic kidney disease: Secondary | ICD-10-CM | POA: Diagnosis not present

## 2020-08-29 DIAGNOSIS — F32A Depression, unspecified: Secondary | ICD-10-CM | POA: Diagnosis not present

## 2020-08-29 DIAGNOSIS — G20 Parkinson's disease: Secondary | ICD-10-CM | POA: Diagnosis not present

## 2020-08-29 DIAGNOSIS — F0280 Dementia in other diseases classified elsewhere without behavioral disturbance: Secondary | ICD-10-CM | POA: Diagnosis not present

## 2020-08-29 DIAGNOSIS — I2510 Atherosclerotic heart disease of native coronary artery without angina pectoris: Secondary | ICD-10-CM | POA: Diagnosis not present

## 2020-08-29 DIAGNOSIS — E1122 Type 2 diabetes mellitus with diabetic chronic kidney disease: Secondary | ICD-10-CM | POA: Diagnosis not present

## 2020-08-29 DIAGNOSIS — N189 Chronic kidney disease, unspecified: Secondary | ICD-10-CM | POA: Diagnosis not present

## 2020-08-29 DIAGNOSIS — I129 Hypertensive chronic kidney disease with stage 1 through stage 4 chronic kidney disease, or unspecified chronic kidney disease: Secondary | ICD-10-CM | POA: Diagnosis not present

## 2020-08-29 DIAGNOSIS — F419 Anxiety disorder, unspecified: Secondary | ICD-10-CM | POA: Diagnosis not present

## 2020-08-29 DIAGNOSIS — S42302G Unspecified fracture of shaft of humerus, left arm, subsequent encounter for fracture with delayed healing: Secondary | ICD-10-CM | POA: Diagnosis not present

## 2020-09-02 DIAGNOSIS — S42302G Unspecified fracture of shaft of humerus, left arm, subsequent encounter for fracture with delayed healing: Secondary | ICD-10-CM | POA: Diagnosis not present

## 2020-09-02 DIAGNOSIS — E1122 Type 2 diabetes mellitus with diabetic chronic kidney disease: Secondary | ICD-10-CM | POA: Diagnosis not present

## 2020-09-02 DIAGNOSIS — F419 Anxiety disorder, unspecified: Secondary | ICD-10-CM | POA: Diagnosis not present

## 2020-09-02 DIAGNOSIS — N189 Chronic kidney disease, unspecified: Secondary | ICD-10-CM | POA: Diagnosis not present

## 2020-09-02 DIAGNOSIS — I2510 Atherosclerotic heart disease of native coronary artery without angina pectoris: Secondary | ICD-10-CM | POA: Diagnosis not present

## 2020-09-02 DIAGNOSIS — F0280 Dementia in other diseases classified elsewhere without behavioral disturbance: Secondary | ICD-10-CM | POA: Diagnosis not present

## 2020-09-02 DIAGNOSIS — I129 Hypertensive chronic kidney disease with stage 1 through stage 4 chronic kidney disease, or unspecified chronic kidney disease: Secondary | ICD-10-CM | POA: Diagnosis not present

## 2020-09-02 DIAGNOSIS — F32A Depression, unspecified: Secondary | ICD-10-CM | POA: Diagnosis not present

## 2020-09-02 DIAGNOSIS — G20 Parkinson's disease: Secondary | ICD-10-CM | POA: Diagnosis not present

## 2020-09-04 DIAGNOSIS — N189 Chronic kidney disease, unspecified: Secondary | ICD-10-CM | POA: Diagnosis not present

## 2020-09-04 DIAGNOSIS — G20 Parkinson's disease: Secondary | ICD-10-CM | POA: Diagnosis not present

## 2020-09-04 DIAGNOSIS — I2510 Atherosclerotic heart disease of native coronary artery without angina pectoris: Secondary | ICD-10-CM | POA: Diagnosis not present

## 2020-09-04 DIAGNOSIS — F32A Depression, unspecified: Secondary | ICD-10-CM | POA: Diagnosis not present

## 2020-09-04 DIAGNOSIS — S42302G Unspecified fracture of shaft of humerus, left arm, subsequent encounter for fracture with delayed healing: Secondary | ICD-10-CM | POA: Diagnosis not present

## 2020-09-04 DIAGNOSIS — I129 Hypertensive chronic kidney disease with stage 1 through stage 4 chronic kidney disease, or unspecified chronic kidney disease: Secondary | ICD-10-CM | POA: Diagnosis not present

## 2020-09-04 DIAGNOSIS — E1122 Type 2 diabetes mellitus with diabetic chronic kidney disease: Secondary | ICD-10-CM | POA: Diagnosis not present

## 2020-09-04 DIAGNOSIS — F0280 Dementia in other diseases classified elsewhere without behavioral disturbance: Secondary | ICD-10-CM | POA: Diagnosis not present

## 2020-09-04 DIAGNOSIS — F419 Anxiety disorder, unspecified: Secondary | ICD-10-CM | POA: Diagnosis not present

## 2020-09-10 DIAGNOSIS — G20 Parkinson's disease: Secondary | ICD-10-CM | POA: Diagnosis not present

## 2020-09-10 DIAGNOSIS — I129 Hypertensive chronic kidney disease with stage 1 through stage 4 chronic kidney disease, or unspecified chronic kidney disease: Secondary | ICD-10-CM | POA: Diagnosis not present

## 2020-09-10 DIAGNOSIS — E1122 Type 2 diabetes mellitus with diabetic chronic kidney disease: Secondary | ICD-10-CM | POA: Diagnosis not present

## 2020-09-10 DIAGNOSIS — I2510 Atherosclerotic heart disease of native coronary artery without angina pectoris: Secondary | ICD-10-CM | POA: Diagnosis not present

## 2020-09-10 DIAGNOSIS — F32A Depression, unspecified: Secondary | ICD-10-CM | POA: Diagnosis not present

## 2020-09-10 DIAGNOSIS — F419 Anxiety disorder, unspecified: Secondary | ICD-10-CM | POA: Diagnosis not present

## 2020-09-10 DIAGNOSIS — N189 Chronic kidney disease, unspecified: Secondary | ICD-10-CM | POA: Diagnosis not present

## 2020-09-10 DIAGNOSIS — S42302G Unspecified fracture of shaft of humerus, left arm, subsequent encounter for fracture with delayed healing: Secondary | ICD-10-CM | POA: Diagnosis not present

## 2020-09-10 DIAGNOSIS — F0280 Dementia in other diseases classified elsewhere without behavioral disturbance: Secondary | ICD-10-CM | POA: Diagnosis not present

## 2020-09-11 DIAGNOSIS — I129 Hypertensive chronic kidney disease with stage 1 through stage 4 chronic kidney disease, or unspecified chronic kidney disease: Secondary | ICD-10-CM | POA: Diagnosis not present

## 2020-09-11 DIAGNOSIS — G20 Parkinson's disease: Secondary | ICD-10-CM | POA: Diagnosis not present

## 2020-09-11 DIAGNOSIS — F419 Anxiety disorder, unspecified: Secondary | ICD-10-CM | POA: Diagnosis not present

## 2020-09-11 DIAGNOSIS — I2510 Atherosclerotic heart disease of native coronary artery without angina pectoris: Secondary | ICD-10-CM | POA: Diagnosis not present

## 2020-09-11 DIAGNOSIS — S42302G Unspecified fracture of shaft of humerus, left arm, subsequent encounter for fracture with delayed healing: Secondary | ICD-10-CM | POA: Diagnosis not present

## 2020-09-11 DIAGNOSIS — F32A Depression, unspecified: Secondary | ICD-10-CM | POA: Diagnosis not present

## 2020-09-11 DIAGNOSIS — N189 Chronic kidney disease, unspecified: Secondary | ICD-10-CM | POA: Diagnosis not present

## 2020-09-11 DIAGNOSIS — F0280 Dementia in other diseases classified elsewhere without behavioral disturbance: Secondary | ICD-10-CM | POA: Diagnosis not present

## 2020-09-11 DIAGNOSIS — E1122 Type 2 diabetes mellitus with diabetic chronic kidney disease: Secondary | ICD-10-CM | POA: Diagnosis not present

## 2020-09-18 DIAGNOSIS — I2510 Atherosclerotic heart disease of native coronary artery without angina pectoris: Secondary | ICD-10-CM | POA: Diagnosis not present

## 2020-09-18 DIAGNOSIS — I129 Hypertensive chronic kidney disease with stage 1 through stage 4 chronic kidney disease, or unspecified chronic kidney disease: Secondary | ICD-10-CM | POA: Diagnosis not present

## 2020-09-18 DIAGNOSIS — G20 Parkinson's disease: Secondary | ICD-10-CM | POA: Diagnosis not present

## 2020-09-18 DIAGNOSIS — S42302G Unspecified fracture of shaft of humerus, left arm, subsequent encounter for fracture with delayed healing: Secondary | ICD-10-CM | POA: Diagnosis not present

## 2020-09-18 DIAGNOSIS — E1122 Type 2 diabetes mellitus with diabetic chronic kidney disease: Secondary | ICD-10-CM | POA: Diagnosis not present

## 2020-09-18 DIAGNOSIS — F419 Anxiety disorder, unspecified: Secondary | ICD-10-CM | POA: Diagnosis not present

## 2020-09-18 DIAGNOSIS — N189 Chronic kidney disease, unspecified: Secondary | ICD-10-CM | POA: Diagnosis not present

## 2020-09-18 DIAGNOSIS — F0280 Dementia in other diseases classified elsewhere without behavioral disturbance: Secondary | ICD-10-CM | POA: Diagnosis not present

## 2020-09-18 DIAGNOSIS — F32A Depression, unspecified: Secondary | ICD-10-CM | POA: Diagnosis not present

## 2020-09-30 DIAGNOSIS — I2510 Atherosclerotic heart disease of native coronary artery without angina pectoris: Secondary | ICD-10-CM | POA: Diagnosis not present

## 2020-09-30 DIAGNOSIS — F0280 Dementia in other diseases classified elsewhere without behavioral disturbance: Secondary | ICD-10-CM | POA: Diagnosis not present

## 2020-09-30 DIAGNOSIS — N189 Chronic kidney disease, unspecified: Secondary | ICD-10-CM | POA: Diagnosis not present

## 2020-09-30 DIAGNOSIS — E1122 Type 2 diabetes mellitus with diabetic chronic kidney disease: Secondary | ICD-10-CM | POA: Diagnosis not present

## 2020-09-30 DIAGNOSIS — G20 Parkinson's disease: Secondary | ICD-10-CM | POA: Diagnosis not present

## 2020-09-30 DIAGNOSIS — F419 Anxiety disorder, unspecified: Secondary | ICD-10-CM | POA: Diagnosis not present

## 2020-09-30 DIAGNOSIS — S42302G Unspecified fracture of shaft of humerus, left arm, subsequent encounter for fracture with delayed healing: Secondary | ICD-10-CM | POA: Diagnosis not present

## 2020-09-30 DIAGNOSIS — F32A Depression, unspecified: Secondary | ICD-10-CM | POA: Diagnosis not present

## 2020-09-30 DIAGNOSIS — I129 Hypertensive chronic kidney disease with stage 1 through stage 4 chronic kidney disease, or unspecified chronic kidney disease: Secondary | ICD-10-CM | POA: Diagnosis not present

## 2020-10-02 DIAGNOSIS — F0280 Dementia in other diseases classified elsewhere without behavioral disturbance: Secondary | ICD-10-CM | POA: Diagnosis not present

## 2020-10-02 DIAGNOSIS — F419 Anxiety disorder, unspecified: Secondary | ICD-10-CM | POA: Diagnosis not present

## 2020-10-02 DIAGNOSIS — G20 Parkinson's disease: Secondary | ICD-10-CM | POA: Diagnosis not present

## 2020-10-02 DIAGNOSIS — F32A Depression, unspecified: Secondary | ICD-10-CM | POA: Diagnosis not present

## 2020-10-02 DIAGNOSIS — D6852 Prothrombin gene mutation: Secondary | ICD-10-CM | POA: Diagnosis not present

## 2020-10-02 DIAGNOSIS — E1122 Type 2 diabetes mellitus with diabetic chronic kidney disease: Secondary | ICD-10-CM | POA: Diagnosis not present

## 2020-10-02 DIAGNOSIS — S42302G Unspecified fracture of shaft of humerus, left arm, subsequent encounter for fracture with delayed healing: Secondary | ICD-10-CM | POA: Diagnosis not present

## 2020-10-02 DIAGNOSIS — N189 Chronic kidney disease, unspecified: Secondary | ICD-10-CM | POA: Diagnosis not present

## 2020-10-02 DIAGNOSIS — I129 Hypertensive chronic kidney disease with stage 1 through stage 4 chronic kidney disease, or unspecified chronic kidney disease: Secondary | ICD-10-CM | POA: Diagnosis not present

## 2020-10-02 DIAGNOSIS — I2510 Atherosclerotic heart disease of native coronary artery without angina pectoris: Secondary | ICD-10-CM | POA: Diagnosis not present

## 2020-10-03 DIAGNOSIS — I2510 Atherosclerotic heart disease of native coronary artery without angina pectoris: Secondary | ICD-10-CM | POA: Diagnosis not present

## 2020-10-03 DIAGNOSIS — I129 Hypertensive chronic kidney disease with stage 1 through stage 4 chronic kidney disease, or unspecified chronic kidney disease: Secondary | ICD-10-CM | POA: Diagnosis not present

## 2020-10-03 DIAGNOSIS — F419 Anxiety disorder, unspecified: Secondary | ICD-10-CM | POA: Diagnosis not present

## 2020-10-03 DIAGNOSIS — F32A Depression, unspecified: Secondary | ICD-10-CM | POA: Diagnosis not present

## 2020-10-03 DIAGNOSIS — N189 Chronic kidney disease, unspecified: Secondary | ICD-10-CM | POA: Diagnosis not present

## 2020-10-03 DIAGNOSIS — S42302G Unspecified fracture of shaft of humerus, left arm, subsequent encounter for fracture with delayed healing: Secondary | ICD-10-CM | POA: Diagnosis not present

## 2020-10-03 DIAGNOSIS — G20 Parkinson's disease: Secondary | ICD-10-CM | POA: Diagnosis not present

## 2020-10-03 DIAGNOSIS — E1122 Type 2 diabetes mellitus with diabetic chronic kidney disease: Secondary | ICD-10-CM | POA: Diagnosis not present

## 2020-10-03 DIAGNOSIS — F0280 Dementia in other diseases classified elsewhere without behavioral disturbance: Secondary | ICD-10-CM | POA: Diagnosis not present

## 2020-10-07 DIAGNOSIS — S42302G Unspecified fracture of shaft of humerus, left arm, subsequent encounter for fracture with delayed healing: Secondary | ICD-10-CM | POA: Diagnosis not present

## 2020-10-07 DIAGNOSIS — F32A Depression, unspecified: Secondary | ICD-10-CM | POA: Diagnosis not present

## 2020-10-07 DIAGNOSIS — N189 Chronic kidney disease, unspecified: Secondary | ICD-10-CM | POA: Diagnosis not present

## 2020-10-07 DIAGNOSIS — E1122 Type 2 diabetes mellitus with diabetic chronic kidney disease: Secondary | ICD-10-CM | POA: Diagnosis not present

## 2020-10-07 DIAGNOSIS — G20 Parkinson's disease: Secondary | ICD-10-CM | POA: Diagnosis not present

## 2020-10-07 DIAGNOSIS — I2510 Atherosclerotic heart disease of native coronary artery without angina pectoris: Secondary | ICD-10-CM | POA: Diagnosis not present

## 2020-10-07 DIAGNOSIS — I129 Hypertensive chronic kidney disease with stage 1 through stage 4 chronic kidney disease, or unspecified chronic kidney disease: Secondary | ICD-10-CM | POA: Diagnosis not present

## 2020-10-07 DIAGNOSIS — F0280 Dementia in other diseases classified elsewhere without behavioral disturbance: Secondary | ICD-10-CM | POA: Diagnosis not present

## 2020-10-07 DIAGNOSIS — F419 Anxiety disorder, unspecified: Secondary | ICD-10-CM | POA: Diagnosis not present

## 2020-10-10 DIAGNOSIS — E1122 Type 2 diabetes mellitus with diabetic chronic kidney disease: Secondary | ICD-10-CM | POA: Diagnosis not present

## 2020-10-10 DIAGNOSIS — I2510 Atherosclerotic heart disease of native coronary artery without angina pectoris: Secondary | ICD-10-CM | POA: Diagnosis not present

## 2020-10-10 DIAGNOSIS — F419 Anxiety disorder, unspecified: Secondary | ICD-10-CM | POA: Diagnosis not present

## 2020-10-10 DIAGNOSIS — N189 Chronic kidney disease, unspecified: Secondary | ICD-10-CM | POA: Diagnosis not present

## 2020-10-10 DIAGNOSIS — G20 Parkinson's disease: Secondary | ICD-10-CM | POA: Diagnosis not present

## 2020-10-10 DIAGNOSIS — S42302G Unspecified fracture of shaft of humerus, left arm, subsequent encounter for fracture with delayed healing: Secondary | ICD-10-CM | POA: Diagnosis not present

## 2020-10-10 DIAGNOSIS — I129 Hypertensive chronic kidney disease with stage 1 through stage 4 chronic kidney disease, or unspecified chronic kidney disease: Secondary | ICD-10-CM | POA: Diagnosis not present

## 2020-10-10 DIAGNOSIS — F32A Depression, unspecified: Secondary | ICD-10-CM | POA: Diagnosis not present

## 2020-10-10 DIAGNOSIS — F0280 Dementia in other diseases classified elsewhere without behavioral disturbance: Secondary | ICD-10-CM | POA: Diagnosis not present

## 2020-10-14 DIAGNOSIS — F0280 Dementia in other diseases classified elsewhere without behavioral disturbance: Secondary | ICD-10-CM | POA: Diagnosis not present

## 2020-10-14 DIAGNOSIS — N189 Chronic kidney disease, unspecified: Secondary | ICD-10-CM | POA: Diagnosis not present

## 2020-10-14 DIAGNOSIS — F419 Anxiety disorder, unspecified: Secondary | ICD-10-CM | POA: Diagnosis not present

## 2020-10-14 DIAGNOSIS — S42302G Unspecified fracture of shaft of humerus, left arm, subsequent encounter for fracture with delayed healing: Secondary | ICD-10-CM | POA: Diagnosis not present

## 2020-10-14 DIAGNOSIS — G20 Parkinson's disease: Secondary | ICD-10-CM | POA: Diagnosis not present

## 2020-10-14 DIAGNOSIS — E1122 Type 2 diabetes mellitus with diabetic chronic kidney disease: Secondary | ICD-10-CM | POA: Diagnosis not present

## 2020-10-14 DIAGNOSIS — I129 Hypertensive chronic kidney disease with stage 1 through stage 4 chronic kidney disease, or unspecified chronic kidney disease: Secondary | ICD-10-CM | POA: Diagnosis not present

## 2020-10-14 DIAGNOSIS — F32A Depression, unspecified: Secondary | ICD-10-CM | POA: Diagnosis not present

## 2020-10-14 DIAGNOSIS — I2510 Atherosclerotic heart disease of native coronary artery without angina pectoris: Secondary | ICD-10-CM | POA: Diagnosis not present

## 2020-10-15 DIAGNOSIS — H5203 Hypermetropia, bilateral: Secondary | ICD-10-CM | POA: Diagnosis not present

## 2020-10-15 DIAGNOSIS — H52223 Regular astigmatism, bilateral: Secondary | ICD-10-CM | POA: Diagnosis not present

## 2020-10-15 DIAGNOSIS — H524 Presbyopia: Secondary | ICD-10-CM | POA: Diagnosis not present

## 2020-10-15 DIAGNOSIS — H2513 Age-related nuclear cataract, bilateral: Secondary | ICD-10-CM | POA: Diagnosis not present

## 2020-10-16 DIAGNOSIS — F0280 Dementia in other diseases classified elsewhere without behavioral disturbance: Secondary | ICD-10-CM | POA: Diagnosis not present

## 2020-10-16 DIAGNOSIS — N189 Chronic kidney disease, unspecified: Secondary | ICD-10-CM | POA: Diagnosis not present

## 2020-10-16 DIAGNOSIS — I2510 Atherosclerotic heart disease of native coronary artery without angina pectoris: Secondary | ICD-10-CM | POA: Diagnosis not present

## 2020-10-16 DIAGNOSIS — I129 Hypertensive chronic kidney disease with stage 1 through stage 4 chronic kidney disease, or unspecified chronic kidney disease: Secondary | ICD-10-CM | POA: Diagnosis not present

## 2020-10-16 DIAGNOSIS — S42302G Unspecified fracture of shaft of humerus, left arm, subsequent encounter for fracture with delayed healing: Secondary | ICD-10-CM | POA: Diagnosis not present

## 2020-10-16 DIAGNOSIS — F32A Depression, unspecified: Secondary | ICD-10-CM | POA: Diagnosis not present

## 2020-10-16 DIAGNOSIS — E1122 Type 2 diabetes mellitus with diabetic chronic kidney disease: Secondary | ICD-10-CM | POA: Diagnosis not present

## 2020-10-16 DIAGNOSIS — F419 Anxiety disorder, unspecified: Secondary | ICD-10-CM | POA: Diagnosis not present

## 2020-10-16 DIAGNOSIS — G20 Parkinson's disease: Secondary | ICD-10-CM | POA: Diagnosis not present

## 2020-10-21 DIAGNOSIS — F32A Depression, unspecified: Secondary | ICD-10-CM | POA: Diagnosis not present

## 2020-10-21 DIAGNOSIS — G20 Parkinson's disease: Secondary | ICD-10-CM | POA: Diagnosis not present

## 2020-10-21 DIAGNOSIS — N189 Chronic kidney disease, unspecified: Secondary | ICD-10-CM | POA: Diagnosis not present

## 2020-10-21 DIAGNOSIS — S42302G Unspecified fracture of shaft of humerus, left arm, subsequent encounter for fracture with delayed healing: Secondary | ICD-10-CM | POA: Diagnosis not present

## 2020-10-21 DIAGNOSIS — E1122 Type 2 diabetes mellitus with diabetic chronic kidney disease: Secondary | ICD-10-CM | POA: Diagnosis not present

## 2020-10-21 DIAGNOSIS — I2510 Atherosclerotic heart disease of native coronary artery without angina pectoris: Secondary | ICD-10-CM | POA: Diagnosis not present

## 2020-10-21 DIAGNOSIS — F419 Anxiety disorder, unspecified: Secondary | ICD-10-CM | POA: Diagnosis not present

## 2020-10-21 DIAGNOSIS — I129 Hypertensive chronic kidney disease with stage 1 through stage 4 chronic kidney disease, or unspecified chronic kidney disease: Secondary | ICD-10-CM | POA: Diagnosis not present

## 2020-10-21 DIAGNOSIS — F0280 Dementia in other diseases classified elsewhere without behavioral disturbance: Secondary | ICD-10-CM | POA: Diagnosis not present

## 2020-10-22 DIAGNOSIS — G20 Parkinson's disease: Secondary | ICD-10-CM | POA: Diagnosis not present

## 2020-10-22 DIAGNOSIS — E1122 Type 2 diabetes mellitus with diabetic chronic kidney disease: Secondary | ICD-10-CM | POA: Diagnosis not present

## 2020-10-22 DIAGNOSIS — I10 Essential (primary) hypertension: Secondary | ICD-10-CM | POA: Diagnosis not present

## 2020-10-23 DIAGNOSIS — F0280 Dementia in other diseases classified elsewhere without behavioral disturbance: Secondary | ICD-10-CM | POA: Diagnosis not present

## 2020-10-23 DIAGNOSIS — F32A Depression, unspecified: Secondary | ICD-10-CM | POA: Diagnosis not present

## 2020-10-23 DIAGNOSIS — S42302G Unspecified fracture of shaft of humerus, left arm, subsequent encounter for fracture with delayed healing: Secondary | ICD-10-CM | POA: Diagnosis not present

## 2020-10-23 DIAGNOSIS — E1122 Type 2 diabetes mellitus with diabetic chronic kidney disease: Secondary | ICD-10-CM | POA: Diagnosis not present

## 2020-10-23 DIAGNOSIS — F419 Anxiety disorder, unspecified: Secondary | ICD-10-CM | POA: Diagnosis not present

## 2020-10-23 DIAGNOSIS — G20 Parkinson's disease: Secondary | ICD-10-CM | POA: Diagnosis not present

## 2020-10-23 DIAGNOSIS — I129 Hypertensive chronic kidney disease with stage 1 through stage 4 chronic kidney disease, or unspecified chronic kidney disease: Secondary | ICD-10-CM | POA: Diagnosis not present

## 2020-10-23 DIAGNOSIS — I2510 Atherosclerotic heart disease of native coronary artery without angina pectoris: Secondary | ICD-10-CM | POA: Diagnosis not present

## 2020-10-23 DIAGNOSIS — N189 Chronic kidney disease, unspecified: Secondary | ICD-10-CM | POA: Diagnosis not present

## 2020-10-24 DIAGNOSIS — E1122 Type 2 diabetes mellitus with diabetic chronic kidney disease: Secondary | ICD-10-CM | POA: Diagnosis not present

## 2020-10-24 DIAGNOSIS — D6852 Prothrombin gene mutation: Secondary | ICD-10-CM | POA: Diagnosis not present

## 2020-10-24 DIAGNOSIS — I129 Hypertensive chronic kidney disease with stage 1 through stage 4 chronic kidney disease, or unspecified chronic kidney disease: Secondary | ICD-10-CM | POA: Diagnosis not present

## 2020-10-24 DIAGNOSIS — M25512 Pain in left shoulder: Secondary | ICD-10-CM | POA: Diagnosis not present

## 2020-10-24 DIAGNOSIS — N189 Chronic kidney disease, unspecified: Secondary | ICD-10-CM | POA: Diagnosis not present

## 2020-10-28 DIAGNOSIS — I2510 Atherosclerotic heart disease of native coronary artery without angina pectoris: Secondary | ICD-10-CM | POA: Diagnosis not present

## 2020-10-28 DIAGNOSIS — F0280 Dementia in other diseases classified elsewhere without behavioral disturbance: Secondary | ICD-10-CM | POA: Diagnosis not present

## 2020-10-28 DIAGNOSIS — G20 Parkinson's disease: Secondary | ICD-10-CM | POA: Diagnosis not present

## 2020-10-28 DIAGNOSIS — N189 Chronic kidney disease, unspecified: Secondary | ICD-10-CM | POA: Diagnosis not present

## 2020-10-28 DIAGNOSIS — I129 Hypertensive chronic kidney disease with stage 1 through stage 4 chronic kidney disease, or unspecified chronic kidney disease: Secondary | ICD-10-CM | POA: Diagnosis not present

## 2020-10-28 DIAGNOSIS — E559 Vitamin D deficiency, unspecified: Secondary | ICD-10-CM | POA: Diagnosis not present

## 2020-10-28 DIAGNOSIS — M25512 Pain in left shoulder: Secondary | ICD-10-CM | POA: Diagnosis not present

## 2020-10-28 DIAGNOSIS — N179 Acute kidney failure, unspecified: Secondary | ICD-10-CM | POA: Diagnosis not present

## 2020-10-28 DIAGNOSIS — E1122 Type 2 diabetes mellitus with diabetic chronic kidney disease: Secondary | ICD-10-CM | POA: Diagnosis not present

## 2020-11-07 DIAGNOSIS — G20 Parkinson's disease: Secondary | ICD-10-CM | POA: Diagnosis not present

## 2020-11-07 DIAGNOSIS — M25512 Pain in left shoulder: Secondary | ICD-10-CM | POA: Diagnosis not present

## 2020-11-07 DIAGNOSIS — E1122 Type 2 diabetes mellitus with diabetic chronic kidney disease: Secondary | ICD-10-CM | POA: Diagnosis not present

## 2020-11-07 DIAGNOSIS — I129 Hypertensive chronic kidney disease with stage 1 through stage 4 chronic kidney disease, or unspecified chronic kidney disease: Secondary | ICD-10-CM | POA: Diagnosis not present

## 2020-11-07 DIAGNOSIS — I2510 Atherosclerotic heart disease of native coronary artery without angina pectoris: Secondary | ICD-10-CM | POA: Diagnosis not present

## 2020-11-07 DIAGNOSIS — E559 Vitamin D deficiency, unspecified: Secondary | ICD-10-CM | POA: Diagnosis not present

## 2020-11-07 DIAGNOSIS — N189 Chronic kidney disease, unspecified: Secondary | ICD-10-CM | POA: Diagnosis not present

## 2020-11-07 DIAGNOSIS — N179 Acute kidney failure, unspecified: Secondary | ICD-10-CM | POA: Diagnosis not present

## 2020-11-07 DIAGNOSIS — F0280 Dementia in other diseases classified elsewhere without behavioral disturbance: Secondary | ICD-10-CM | POA: Diagnosis not present

## 2020-11-10 DIAGNOSIS — N179 Acute kidney failure, unspecified: Secondary | ICD-10-CM | POA: Diagnosis not present

## 2020-11-10 DIAGNOSIS — N189 Chronic kidney disease, unspecified: Secondary | ICD-10-CM | POA: Diagnosis not present

## 2020-11-10 DIAGNOSIS — F0280 Dementia in other diseases classified elsewhere without behavioral disturbance: Secondary | ICD-10-CM | POA: Diagnosis not present

## 2020-11-10 DIAGNOSIS — G20 Parkinson's disease: Secondary | ICD-10-CM | POA: Diagnosis not present

## 2020-11-10 DIAGNOSIS — M25512 Pain in left shoulder: Secondary | ICD-10-CM | POA: Diagnosis not present

## 2020-11-10 DIAGNOSIS — E1122 Type 2 diabetes mellitus with diabetic chronic kidney disease: Secondary | ICD-10-CM | POA: Diagnosis not present

## 2020-11-10 DIAGNOSIS — I2510 Atherosclerotic heart disease of native coronary artery without angina pectoris: Secondary | ICD-10-CM | POA: Diagnosis not present

## 2020-11-10 DIAGNOSIS — E559 Vitamin D deficiency, unspecified: Secondary | ICD-10-CM | POA: Diagnosis not present

## 2020-11-10 DIAGNOSIS — I129 Hypertensive chronic kidney disease with stage 1 through stage 4 chronic kidney disease, or unspecified chronic kidney disease: Secondary | ICD-10-CM | POA: Diagnosis not present

## 2020-11-12 DIAGNOSIS — R799 Abnormal finding of blood chemistry, unspecified: Secondary | ICD-10-CM | POA: Diagnosis not present

## 2020-11-12 DIAGNOSIS — D519 Vitamin B12 deficiency anemia, unspecified: Secondary | ICD-10-CM | POA: Diagnosis not present

## 2020-11-12 DIAGNOSIS — E559 Vitamin D deficiency, unspecified: Secondary | ICD-10-CM | POA: Diagnosis not present

## 2020-11-12 DIAGNOSIS — Z13228 Encounter for screening for other metabolic disorders: Secondary | ICD-10-CM | POA: Diagnosis not present

## 2020-11-12 DIAGNOSIS — I10 Essential (primary) hypertension: Secondary | ICD-10-CM | POA: Diagnosis not present

## 2020-11-12 DIAGNOSIS — D509 Iron deficiency anemia, unspecified: Secondary | ICD-10-CM | POA: Diagnosis not present

## 2020-11-12 DIAGNOSIS — E7800 Pure hypercholesterolemia, unspecified: Secondary | ICD-10-CM | POA: Diagnosis not present

## 2020-11-13 DIAGNOSIS — F0390 Unspecified dementia without behavioral disturbance: Secondary | ICD-10-CM | POA: Diagnosis not present

## 2020-11-13 DIAGNOSIS — F419 Anxiety disorder, unspecified: Secondary | ICD-10-CM | POA: Diagnosis not present

## 2020-11-13 DIAGNOSIS — G20 Parkinson's disease: Secondary | ICD-10-CM | POA: Diagnosis not present

## 2020-11-13 DIAGNOSIS — I10 Essential (primary) hypertension: Secondary | ICD-10-CM | POA: Diagnosis not present

## 2020-11-17 DIAGNOSIS — N179 Acute kidney failure, unspecified: Secondary | ICD-10-CM | POA: Diagnosis not present

## 2020-11-17 DIAGNOSIS — G20 Parkinson's disease: Secondary | ICD-10-CM | POA: Diagnosis not present

## 2020-11-17 DIAGNOSIS — M25512 Pain in left shoulder: Secondary | ICD-10-CM | POA: Diagnosis not present

## 2020-11-17 DIAGNOSIS — I2510 Atherosclerotic heart disease of native coronary artery without angina pectoris: Secondary | ICD-10-CM | POA: Diagnosis not present

## 2020-11-17 DIAGNOSIS — E559 Vitamin D deficiency, unspecified: Secondary | ICD-10-CM | POA: Diagnosis not present

## 2020-11-17 DIAGNOSIS — I272 Pulmonary hypertension, unspecified: Secondary | ICD-10-CM | POA: Diagnosis not present

## 2020-11-17 DIAGNOSIS — I129 Hypertensive chronic kidney disease with stage 1 through stage 4 chronic kidney disease, or unspecified chronic kidney disease: Secondary | ICD-10-CM | POA: Diagnosis not present

## 2020-11-17 DIAGNOSIS — E1122 Type 2 diabetes mellitus with diabetic chronic kidney disease: Secondary | ICD-10-CM | POA: Diagnosis not present

## 2020-11-17 DIAGNOSIS — R9431 Abnormal electrocardiogram [ECG] [EKG]: Secondary | ICD-10-CM | POA: Diagnosis not present

## 2020-11-17 DIAGNOSIS — F0280 Dementia in other diseases classified elsewhere without behavioral disturbance: Secondary | ICD-10-CM | POA: Diagnosis not present

## 2020-11-17 DIAGNOSIS — N189 Chronic kidney disease, unspecified: Secondary | ICD-10-CM | POA: Diagnosis not present

## 2020-11-24 DIAGNOSIS — F0280 Dementia in other diseases classified elsewhere without behavioral disturbance: Secondary | ICD-10-CM | POA: Diagnosis not present

## 2020-11-24 DIAGNOSIS — G20 Parkinson's disease: Secondary | ICD-10-CM | POA: Diagnosis not present

## 2020-11-24 DIAGNOSIS — I129 Hypertensive chronic kidney disease with stage 1 through stage 4 chronic kidney disease, or unspecified chronic kidney disease: Secondary | ICD-10-CM | POA: Diagnosis not present

## 2020-11-24 DIAGNOSIS — I2510 Atherosclerotic heart disease of native coronary artery without angina pectoris: Secondary | ICD-10-CM | POA: Diagnosis not present

## 2020-11-24 DIAGNOSIS — E1122 Type 2 diabetes mellitus with diabetic chronic kidney disease: Secondary | ICD-10-CM | POA: Diagnosis not present

## 2020-11-24 DIAGNOSIS — E559 Vitamin D deficiency, unspecified: Secondary | ICD-10-CM | POA: Diagnosis not present

## 2020-11-24 DIAGNOSIS — M47816 Spondylosis without myelopathy or radiculopathy, lumbar region: Secondary | ICD-10-CM | POA: Diagnosis not present

## 2020-11-24 DIAGNOSIS — M549 Dorsalgia, unspecified: Secondary | ICD-10-CM | POA: Diagnosis not present

## 2020-11-24 DIAGNOSIS — N189 Chronic kidney disease, unspecified: Secondary | ICD-10-CM | POA: Diagnosis not present

## 2020-11-24 DIAGNOSIS — N179 Acute kidney failure, unspecified: Secondary | ICD-10-CM | POA: Diagnosis not present

## 2020-11-24 DIAGNOSIS — W19XXXA Unspecified fall, initial encounter: Secondary | ICD-10-CM | POA: Diagnosis not present

## 2020-11-24 DIAGNOSIS — M25512 Pain in left shoulder: Secondary | ICD-10-CM | POA: Diagnosis not present

## 2020-11-25 DIAGNOSIS — R4182 Altered mental status, unspecified: Secondary | ICD-10-CM | POA: Diagnosis not present

## 2020-11-25 DIAGNOSIS — S32019A Unspecified fracture of first lumbar vertebra, initial encounter for closed fracture: Secondary | ICD-10-CM | POA: Diagnosis not present

## 2020-11-25 DIAGNOSIS — M549 Dorsalgia, unspecified: Secondary | ICD-10-CM | POA: Diagnosis not present

## 2020-11-25 DIAGNOSIS — E785 Hyperlipidemia, unspecified: Secondary | ICD-10-CM | POA: Diagnosis not present

## 2020-11-25 DIAGNOSIS — S32018A Other fracture of first lumbar vertebra, initial encounter for closed fracture: Secondary | ICD-10-CM | POA: Diagnosis not present

## 2020-11-25 DIAGNOSIS — D6852 Prothrombin gene mutation: Secondary | ICD-10-CM | POA: Diagnosis not present

## 2020-11-25 DIAGNOSIS — F32A Depression, unspecified: Secondary | ICD-10-CM | POA: Diagnosis not present

## 2020-11-25 DIAGNOSIS — G20 Parkinson's disease: Secondary | ICD-10-CM | POA: Diagnosis not present

## 2020-11-25 DIAGNOSIS — Z66 Do not resuscitate: Secondary | ICD-10-CM | POA: Diagnosis not present

## 2020-11-25 DIAGNOSIS — R451 Restlessness and agitation: Secondary | ICD-10-CM | POA: Diagnosis not present

## 2020-11-25 DIAGNOSIS — E119 Type 2 diabetes mellitus without complications: Secondary | ICD-10-CM | POA: Diagnosis not present

## 2020-11-25 DIAGNOSIS — M5136 Other intervertebral disc degeneration, lumbar region: Secondary | ICD-10-CM | POA: Diagnosis not present

## 2020-11-25 DIAGNOSIS — F0390 Unspecified dementia without behavioral disturbance: Secondary | ICD-10-CM | POA: Diagnosis not present

## 2020-11-25 DIAGNOSIS — S32010A Wedge compression fracture of first lumbar vertebra, initial encounter for closed fracture: Secondary | ICD-10-CM | POA: Diagnosis not present

## 2020-11-25 DIAGNOSIS — M5459 Other low back pain: Secondary | ICD-10-CM | POA: Diagnosis not present

## 2020-11-25 DIAGNOSIS — F339 Major depressive disorder, recurrent, unspecified: Secondary | ICD-10-CM | POA: Diagnosis not present

## 2020-11-25 DIAGNOSIS — R296 Repeated falls: Secondary | ICD-10-CM | POA: Diagnosis not present

## 2020-11-25 DIAGNOSIS — Z7189 Other specified counseling: Secondary | ICD-10-CM | POA: Diagnosis not present

## 2020-11-25 DIAGNOSIS — G9389 Other specified disorders of brain: Secondary | ICD-10-CM | POA: Diagnosis not present

## 2020-11-25 DIAGNOSIS — G629 Polyneuropathy, unspecified: Secondary | ICD-10-CM | POA: Diagnosis not present

## 2020-11-25 DIAGNOSIS — G4700 Insomnia, unspecified: Secondary | ICD-10-CM | POA: Diagnosis not present

## 2020-11-25 DIAGNOSIS — R2689 Other abnormalities of gait and mobility: Secondary | ICD-10-CM | POA: Diagnosis not present

## 2020-11-25 DIAGNOSIS — Z0389 Encounter for observation for other suspected diseases and conditions ruled out: Secondary | ICD-10-CM | POA: Diagnosis not present

## 2020-11-25 DIAGNOSIS — Z515 Encounter for palliative care: Secondary | ICD-10-CM | POA: Diagnosis not present

## 2020-11-25 DIAGNOSIS — F419 Anxiety disorder, unspecified: Secondary | ICD-10-CM | POA: Diagnosis not present

## 2020-11-25 DIAGNOSIS — Z7409 Other reduced mobility: Secondary | ICD-10-CM | POA: Diagnosis not present

## 2020-11-25 DIAGNOSIS — I10 Essential (primary) hypertension: Secondary | ICD-10-CM | POA: Diagnosis not present

## 2020-11-25 DIAGNOSIS — F418 Other specified anxiety disorders: Secondary | ICD-10-CM | POA: Diagnosis not present

## 2020-11-25 DIAGNOSIS — F05 Delirium due to known physiological condition: Secondary | ICD-10-CM | POA: Diagnosis not present

## 2020-11-25 DIAGNOSIS — S32010D Wedge compression fracture of first lumbar vertebra, subsequent encounter for fracture with routine healing: Secondary | ICD-10-CM | POA: Diagnosis not present

## 2020-11-25 DIAGNOSIS — F0280 Dementia in other diseases classified elsewhere without behavioral disturbance: Secondary | ICD-10-CM | POA: Diagnosis not present

## 2020-11-25 DIAGNOSIS — E039 Hypothyroidism, unspecified: Secondary | ICD-10-CM | POA: Diagnosis not present

## 2020-11-27 DIAGNOSIS — D6852 Prothrombin gene mutation: Secondary | ICD-10-CM | POA: Diagnosis not present

## 2020-12-03 DIAGNOSIS — S3991XA Unspecified injury of abdomen, initial encounter: Secondary | ICD-10-CM | POA: Diagnosis not present

## 2020-12-03 DIAGNOSIS — S2241XA Multiple fractures of ribs, right side, initial encounter for closed fracture: Secondary | ICD-10-CM | POA: Diagnosis not present

## 2020-12-03 DIAGNOSIS — S301XXA Contusion of abdominal wall, initial encounter: Secondary | ICD-10-CM | POA: Diagnosis not present

## 2020-12-03 DIAGNOSIS — F419 Anxiety disorder, unspecified: Secondary | ICD-10-CM | POA: Diagnosis not present

## 2020-12-03 DIAGNOSIS — F05 Delirium due to known physiological condition: Secondary | ICD-10-CM | POA: Diagnosis not present

## 2020-12-03 DIAGNOSIS — W19XXXA Unspecified fall, initial encounter: Secondary | ICD-10-CM | POA: Diagnosis not present

## 2020-12-03 DIAGNOSIS — G4700 Insomnia, unspecified: Secondary | ICD-10-CM | POA: Diagnosis not present

## 2020-12-03 DIAGNOSIS — E785 Hyperlipidemia, unspecified: Secondary | ICD-10-CM | POA: Diagnosis not present

## 2020-12-03 DIAGNOSIS — M5136 Other intervertebral disc degeneration, lumbar region: Secondary | ICD-10-CM | POA: Diagnosis not present

## 2020-12-03 DIAGNOSIS — G20 Parkinson's disease: Secondary | ICD-10-CM | POA: Diagnosis not present

## 2020-12-03 DIAGNOSIS — R0789 Other chest pain: Secondary | ICD-10-CM | POA: Diagnosis not present

## 2020-12-03 DIAGNOSIS — R1031 Right lower quadrant pain: Secondary | ICD-10-CM | POA: Diagnosis not present

## 2020-12-03 DIAGNOSIS — R569 Unspecified convulsions: Secondary | ICD-10-CM | POA: Diagnosis not present

## 2020-12-03 DIAGNOSIS — S32010A Wedge compression fracture of first lumbar vertebra, initial encounter for closed fracture: Secondary | ICD-10-CM | POA: Diagnosis not present

## 2020-12-03 DIAGNOSIS — S20212A Contusion of left front wall of thorax, initial encounter: Secondary | ICD-10-CM | POA: Diagnosis not present

## 2020-12-03 DIAGNOSIS — R4182 Altered mental status, unspecified: Secondary | ICD-10-CM | POA: Diagnosis not present

## 2020-12-03 DIAGNOSIS — E039 Hypothyroidism, unspecified: Secondary | ICD-10-CM | POA: Diagnosis not present

## 2020-12-03 DIAGNOSIS — F32A Depression, unspecified: Secondary | ICD-10-CM | POA: Diagnosis not present

## 2020-12-03 DIAGNOSIS — R296 Repeated falls: Secondary | ICD-10-CM | POA: Diagnosis not present

## 2020-12-03 DIAGNOSIS — R2689 Other abnormalities of gait and mobility: Secondary | ICD-10-CM | POA: Diagnosis not present

## 2020-12-03 DIAGNOSIS — S27329A Contusion of lung, unspecified, initial encounter: Secondary | ICD-10-CM | POA: Diagnosis not present

## 2020-12-03 DIAGNOSIS — S20219A Contusion of unspecified front wall of thorax, initial encounter: Secondary | ICD-10-CM | POA: Diagnosis not present

## 2020-12-03 DIAGNOSIS — F0781 Postconcussional syndrome: Secondary | ICD-10-CM | POA: Diagnosis not present

## 2020-12-03 DIAGNOSIS — E119 Type 2 diabetes mellitus without complications: Secondary | ICD-10-CM | POA: Diagnosis not present

## 2020-12-03 DIAGNOSIS — S32010D Wedge compression fracture of first lumbar vertebra, subsequent encounter for fracture with routine healing: Secondary | ICD-10-CM | POA: Diagnosis not present

## 2020-12-03 DIAGNOSIS — F339 Major depressive disorder, recurrent, unspecified: Secondary | ICD-10-CM | POA: Diagnosis not present

## 2020-12-03 DIAGNOSIS — F0390 Unspecified dementia without behavioral disturbance: Secondary | ICD-10-CM | POA: Diagnosis not present

## 2020-12-03 DIAGNOSIS — I10 Essential (primary) hypertension: Secondary | ICD-10-CM | POA: Diagnosis not present

## 2020-12-03 DIAGNOSIS — R0689 Other abnormalities of breathing: Secondary | ICD-10-CM | POA: Diagnosis not present

## 2020-12-03 DIAGNOSIS — R5381 Other malaise: Secondary | ICD-10-CM | POA: Diagnosis not present

## 2020-12-03 DIAGNOSIS — F0280 Dementia in other diseases classified elsewhere without behavioral disturbance: Secondary | ICD-10-CM | POA: Diagnosis not present

## 2020-12-03 DIAGNOSIS — R000 Tachycardia, unspecified: Secondary | ICD-10-CM | POA: Diagnosis not present

## 2020-12-05 DIAGNOSIS — I10 Essential (primary) hypertension: Secondary | ICD-10-CM | POA: Diagnosis not present

## 2020-12-05 DIAGNOSIS — F419 Anxiety disorder, unspecified: Secondary | ICD-10-CM | POA: Diagnosis not present

## 2020-12-05 DIAGNOSIS — R5381 Other malaise: Secondary | ICD-10-CM | POA: Diagnosis not present

## 2020-12-05 DIAGNOSIS — S32010D Wedge compression fracture of first lumbar vertebra, subsequent encounter for fracture with routine healing: Secondary | ICD-10-CM | POA: Diagnosis not present

## 2020-12-05 DIAGNOSIS — F0390 Unspecified dementia without behavioral disturbance: Secondary | ICD-10-CM | POA: Diagnosis not present

## 2020-12-05 DIAGNOSIS — M5136 Other intervertebral disc degeneration, lumbar region: Secondary | ICD-10-CM | POA: Diagnosis not present

## 2020-12-05 DIAGNOSIS — E039 Hypothyroidism, unspecified: Secondary | ICD-10-CM | POA: Diagnosis not present

## 2020-12-05 DIAGNOSIS — G20 Parkinson's disease: Secondary | ICD-10-CM | POA: Diagnosis not present

## 2020-12-05 DIAGNOSIS — F339 Major depressive disorder, recurrent, unspecified: Secondary | ICD-10-CM | POA: Diagnosis not present

## 2020-12-08 DIAGNOSIS — G20 Parkinson's disease: Secondary | ICD-10-CM | POA: Diagnosis not present

## 2020-12-08 DIAGNOSIS — S32010D Wedge compression fracture of first lumbar vertebra, subsequent encounter for fracture with routine healing: Secondary | ICD-10-CM | POA: Diagnosis not present

## 2020-12-08 DIAGNOSIS — R5381 Other malaise: Secondary | ICD-10-CM | POA: Diagnosis not present

## 2020-12-08 DIAGNOSIS — F0390 Unspecified dementia without behavioral disturbance: Secondary | ICD-10-CM | POA: Diagnosis not present

## 2020-12-08 DIAGNOSIS — M5136 Other intervertebral disc degeneration, lumbar region: Secondary | ICD-10-CM | POA: Diagnosis not present

## 2020-12-08 DIAGNOSIS — F339 Major depressive disorder, recurrent, unspecified: Secondary | ICD-10-CM | POA: Diagnosis not present

## 2020-12-09 DIAGNOSIS — F339 Major depressive disorder, recurrent, unspecified: Secondary | ICD-10-CM | POA: Diagnosis not present

## 2020-12-09 DIAGNOSIS — S32010D Wedge compression fracture of first lumbar vertebra, subsequent encounter for fracture with routine healing: Secondary | ICD-10-CM | POA: Diagnosis not present

## 2020-12-09 DIAGNOSIS — G20 Parkinson's disease: Secondary | ICD-10-CM | POA: Diagnosis not present

## 2020-12-09 DIAGNOSIS — M5136 Other intervertebral disc degeneration, lumbar region: Secondary | ICD-10-CM | POA: Diagnosis not present

## 2020-12-09 DIAGNOSIS — F0390 Unspecified dementia without behavioral disturbance: Secondary | ICD-10-CM | POA: Diagnosis not present

## 2020-12-09 DIAGNOSIS — R5381 Other malaise: Secondary | ICD-10-CM | POA: Diagnosis not present

## 2020-12-10 DIAGNOSIS — R5381 Other malaise: Secondary | ICD-10-CM | POA: Diagnosis not present

## 2020-12-10 DIAGNOSIS — M5136 Other intervertebral disc degeneration, lumbar region: Secondary | ICD-10-CM | POA: Diagnosis not present

## 2020-12-10 DIAGNOSIS — S32010D Wedge compression fracture of first lumbar vertebra, subsequent encounter for fracture with routine healing: Secondary | ICD-10-CM | POA: Diagnosis not present

## 2020-12-10 DIAGNOSIS — F0390 Unspecified dementia without behavioral disturbance: Secondary | ICD-10-CM | POA: Diagnosis not present

## 2020-12-10 DIAGNOSIS — F339 Major depressive disorder, recurrent, unspecified: Secondary | ICD-10-CM | POA: Diagnosis not present

## 2020-12-10 DIAGNOSIS — G20 Parkinson's disease: Secondary | ICD-10-CM | POA: Diagnosis not present

## 2020-12-12 DIAGNOSIS — F0390 Unspecified dementia without behavioral disturbance: Secondary | ICD-10-CM | POA: Diagnosis not present

## 2020-12-12 DIAGNOSIS — S32010D Wedge compression fracture of first lumbar vertebra, subsequent encounter for fracture with routine healing: Secondary | ICD-10-CM | POA: Diagnosis not present

## 2020-12-12 DIAGNOSIS — M5136 Other intervertebral disc degeneration, lumbar region: Secondary | ICD-10-CM | POA: Diagnosis not present

## 2020-12-12 DIAGNOSIS — G20 Parkinson's disease: Secondary | ICD-10-CM | POA: Diagnosis not present

## 2020-12-12 DIAGNOSIS — F339 Major depressive disorder, recurrent, unspecified: Secondary | ICD-10-CM | POA: Diagnosis not present

## 2020-12-12 DIAGNOSIS — R5381 Other malaise: Secondary | ICD-10-CM | POA: Diagnosis not present

## 2020-12-15 DIAGNOSIS — S301XXA Contusion of abdominal wall, initial encounter: Secondary | ICD-10-CM | POA: Diagnosis not present

## 2020-12-15 DIAGNOSIS — S32010D Wedge compression fracture of first lumbar vertebra, subsequent encounter for fracture with routine healing: Secondary | ICD-10-CM | POA: Diagnosis not present

## 2020-12-15 DIAGNOSIS — F339 Major depressive disorder, recurrent, unspecified: Secondary | ICD-10-CM | POA: Diagnosis not present

## 2020-12-15 DIAGNOSIS — F0390 Unspecified dementia without behavioral disturbance: Secondary | ICD-10-CM | POA: Diagnosis not present

## 2020-12-15 DIAGNOSIS — G20 Parkinson's disease: Secondary | ICD-10-CM | POA: Diagnosis not present

## 2020-12-15 DIAGNOSIS — S3991XA Unspecified injury of abdomen, initial encounter: Secondary | ICD-10-CM | POA: Diagnosis not present

## 2020-12-15 DIAGNOSIS — S20219A Contusion of unspecified front wall of thorax, initial encounter: Secondary | ICD-10-CM | POA: Diagnosis not present

## 2020-12-15 DIAGNOSIS — S20212A Contusion of left front wall of thorax, initial encounter: Secondary | ICD-10-CM | POA: Diagnosis not present

## 2020-12-15 DIAGNOSIS — R5381 Other malaise: Secondary | ICD-10-CM | POA: Diagnosis not present

## 2020-12-15 DIAGNOSIS — S32010A Wedge compression fracture of first lumbar vertebra, initial encounter for closed fracture: Secondary | ICD-10-CM | POA: Diagnosis not present

## 2020-12-15 DIAGNOSIS — S2241XA Multiple fractures of ribs, right side, initial encounter for closed fracture: Secondary | ICD-10-CM | POA: Diagnosis not present

## 2020-12-15 DIAGNOSIS — R0789 Other chest pain: Secondary | ICD-10-CM | POA: Diagnosis not present

## 2020-12-15 DIAGNOSIS — R4182 Altered mental status, unspecified: Secondary | ICD-10-CM | POA: Diagnosis not present

## 2020-12-15 DIAGNOSIS — W19XXXA Unspecified fall, initial encounter: Secondary | ICD-10-CM | POA: Diagnosis not present

## 2020-12-15 DIAGNOSIS — M5136 Other intervertebral disc degeneration, lumbar region: Secondary | ICD-10-CM | POA: Diagnosis not present

## 2020-12-16 DIAGNOSIS — G4700 Insomnia, unspecified: Secondary | ICD-10-CM | POA: Diagnosis not present

## 2020-12-16 DIAGNOSIS — I493 Ventricular premature depolarization: Secondary | ICD-10-CM | POA: Diagnosis not present

## 2020-12-16 DIAGNOSIS — S301XXA Contusion of abdominal wall, initial encounter: Secondary | ICD-10-CM | POA: Diagnosis not present

## 2020-12-16 DIAGNOSIS — R4182 Altered mental status, unspecified: Secondary | ICD-10-CM | POA: Diagnosis not present

## 2020-12-16 DIAGNOSIS — Z79899 Other long term (current) drug therapy: Secondary | ICD-10-CM | POA: Diagnosis not present

## 2020-12-16 DIAGNOSIS — F0390 Unspecified dementia without behavioral disturbance: Secondary | ICD-10-CM | POA: Diagnosis not present

## 2020-12-16 DIAGNOSIS — R410 Disorientation, unspecified: Secondary | ICD-10-CM | POA: Diagnosis not present

## 2020-12-16 DIAGNOSIS — S20219A Contusion of unspecified front wall of thorax, initial encounter: Secondary | ICD-10-CM | POA: Diagnosis not present

## 2020-12-16 DIAGNOSIS — S2241XD Multiple fractures of ribs, right side, subsequent encounter for fracture with routine healing: Secondary | ICD-10-CM | POA: Diagnosis not present

## 2020-12-16 DIAGNOSIS — W19XXXA Unspecified fall, initial encounter: Secondary | ICD-10-CM | POA: Diagnosis not present

## 2020-12-16 DIAGNOSIS — F0280 Dementia in other diseases classified elsewhere without behavioral disturbance: Secondary | ICD-10-CM | POA: Diagnosis not present

## 2020-12-16 DIAGNOSIS — G20 Parkinson's disease: Secondary | ICD-10-CM | POA: Diagnosis not present

## 2020-12-16 DIAGNOSIS — N179 Acute kidney failure, unspecified: Secondary | ICD-10-CM | POA: Diagnosis not present

## 2020-12-16 DIAGNOSIS — Z043 Encounter for examination and observation following other accident: Secondary | ICD-10-CM | POA: Diagnosis not present

## 2020-12-16 DIAGNOSIS — I445 Left posterior fascicular block: Secondary | ICD-10-CM | POA: Diagnosis not present

## 2020-12-16 DIAGNOSIS — S0003XA Contusion of scalp, initial encounter: Secondary | ICD-10-CM | POA: Diagnosis not present

## 2020-12-16 DIAGNOSIS — S27329A Contusion of lung, unspecified, initial encounter: Secondary | ICD-10-CM | POA: Diagnosis not present

## 2020-12-16 DIAGNOSIS — M549 Dorsalgia, unspecified: Secondary | ICD-10-CM | POA: Diagnosis not present

## 2020-12-16 DIAGNOSIS — R9431 Abnormal electrocardiogram [ECG] [EKG]: Secondary | ICD-10-CM | POA: Diagnosis not present

## 2020-12-16 DIAGNOSIS — S2241XA Multiple fractures of ribs, right side, initial encounter for closed fracture: Secondary | ICD-10-CM | POA: Diagnosis not present

## 2020-12-16 DIAGNOSIS — Z7409 Other reduced mobility: Secondary | ICD-10-CM | POA: Diagnosis not present

## 2020-12-16 DIAGNOSIS — S32019A Unspecified fracture of first lumbar vertebra, initial encounter for closed fracture: Secondary | ICD-10-CM | POA: Diagnosis not present

## 2020-12-16 DIAGNOSIS — R0789 Other chest pain: Secondary | ICD-10-CM | POA: Diagnosis not present

## 2020-12-16 DIAGNOSIS — S2249XA Multiple fractures of ribs, unspecified side, initial encounter for closed fracture: Secondary | ICD-10-CM | POA: Diagnosis not present

## 2020-12-16 DIAGNOSIS — S301XXD Contusion of abdominal wall, subsequent encounter: Secondary | ICD-10-CM | POA: Diagnosis not present

## 2020-12-16 DIAGNOSIS — Z9189 Other specified personal risk factors, not elsewhere classified: Secondary | ICD-10-CM | POA: Diagnosis not present

## 2020-12-16 DIAGNOSIS — F339 Major depressive disorder, recurrent, unspecified: Secondary | ICD-10-CM | POA: Diagnosis not present

## 2020-12-16 DIAGNOSIS — M5136 Other intervertebral disc degeneration, lumbar region: Secondary | ICD-10-CM | POA: Diagnosis not present

## 2020-12-16 DIAGNOSIS — E1165 Type 2 diabetes mellitus with hyperglycemia: Secondary | ICD-10-CM | POA: Diagnosis not present

## 2020-12-16 DIAGNOSIS — I10 Essential (primary) hypertension: Secondary | ICD-10-CM | POA: Diagnosis not present

## 2020-12-16 DIAGNOSIS — S32010D Wedge compression fracture of first lumbar vertebra, subsequent encounter for fracture with routine healing: Secondary | ICD-10-CM | POA: Diagnosis not present

## 2020-12-16 DIAGNOSIS — F419 Anxiety disorder, unspecified: Secondary | ICD-10-CM | POA: Diagnosis not present

## 2020-12-16 DIAGNOSIS — G8911 Acute pain due to trauma: Secondary | ICD-10-CM | POA: Diagnosis not present

## 2020-12-22 DIAGNOSIS — R0789 Other chest pain: Secondary | ICD-10-CM | POA: Diagnosis not present

## 2020-12-22 DIAGNOSIS — M549 Dorsalgia, unspecified: Secondary | ICD-10-CM | POA: Diagnosis not present

## 2020-12-22 DIAGNOSIS — Z789 Other specified health status: Secondary | ICD-10-CM | POA: Diagnosis not present

## 2020-12-22 DIAGNOSIS — R5381 Other malaise: Secondary | ICD-10-CM | POA: Diagnosis not present

## 2020-12-22 DIAGNOSIS — G20 Parkinson's disease: Secondary | ICD-10-CM | POA: Diagnosis not present

## 2020-12-22 DIAGNOSIS — S301XXA Contusion of abdominal wall, initial encounter: Secondary | ICD-10-CM | POA: Diagnosis not present

## 2020-12-22 DIAGNOSIS — R4182 Altered mental status, unspecified: Secondary | ICD-10-CM | POA: Diagnosis not present

## 2020-12-22 DIAGNOSIS — M5136 Other intervertebral disc degeneration, lumbar region: Secondary | ICD-10-CM | POA: Diagnosis not present

## 2020-12-22 DIAGNOSIS — F339 Major depressive disorder, recurrent, unspecified: Secondary | ICD-10-CM | POA: Diagnosis not present

## 2020-12-22 DIAGNOSIS — G4700 Insomnia, unspecified: Secondary | ICD-10-CM | POA: Diagnosis not present

## 2020-12-22 DIAGNOSIS — I10 Essential (primary) hypertension: Secondary | ICD-10-CM | POA: Diagnosis not present

## 2020-12-22 DIAGNOSIS — W19XXXA Unspecified fall, initial encounter: Secondary | ICD-10-CM | POA: Diagnosis not present

## 2020-12-22 DIAGNOSIS — S271XXD Traumatic hemothorax, subsequent encounter: Secondary | ICD-10-CM | POA: Diagnosis not present

## 2020-12-22 DIAGNOSIS — F419 Anxiety disorder, unspecified: Secondary | ICD-10-CM | POA: Diagnosis not present

## 2020-12-22 DIAGNOSIS — S20219A Contusion of unspecified front wall of thorax, initial encounter: Secondary | ICD-10-CM | POA: Diagnosis not present

## 2020-12-22 DIAGNOSIS — S301XXD Contusion of abdominal wall, subsequent encounter: Secondary | ICD-10-CM | POA: Diagnosis not present

## 2020-12-22 DIAGNOSIS — S2241XD Multiple fractures of ribs, right side, subsequent encounter for fracture with routine healing: Secondary | ICD-10-CM | POA: Diagnosis not present

## 2020-12-22 DIAGNOSIS — G8911 Acute pain due to trauma: Secondary | ICD-10-CM | POA: Diagnosis not present

## 2020-12-22 DIAGNOSIS — S32010D Wedge compression fracture of first lumbar vertebra, subsequent encounter for fracture with routine healing: Secondary | ICD-10-CM | POA: Diagnosis not present

## 2020-12-22 DIAGNOSIS — R410 Disorientation, unspecified: Secondary | ICD-10-CM | POA: Diagnosis not present

## 2020-12-22 DIAGNOSIS — F0390 Unspecified dementia without behavioral disturbance: Secondary | ICD-10-CM | POA: Diagnosis not present

## 2020-12-23 DIAGNOSIS — M5136 Other intervertebral disc degeneration, lumbar region: Secondary | ICD-10-CM | POA: Diagnosis not present

## 2020-12-23 DIAGNOSIS — S2241XD Multiple fractures of ribs, right side, subsequent encounter for fracture with routine healing: Secondary | ICD-10-CM | POA: Diagnosis not present

## 2020-12-23 DIAGNOSIS — Z789 Other specified health status: Secondary | ICD-10-CM | POA: Diagnosis not present

## 2020-12-23 DIAGNOSIS — R5381 Other malaise: Secondary | ICD-10-CM | POA: Diagnosis not present

## 2020-12-23 DIAGNOSIS — S32010D Wedge compression fracture of first lumbar vertebra, subsequent encounter for fracture with routine healing: Secondary | ICD-10-CM | POA: Diagnosis not present

## 2020-12-23 DIAGNOSIS — S271XXD Traumatic hemothorax, subsequent encounter: Secondary | ICD-10-CM | POA: Diagnosis not present

## 2020-12-23 DIAGNOSIS — G20 Parkinson's disease: Secondary | ICD-10-CM | POA: Diagnosis not present

## 2020-12-23 DIAGNOSIS — S301XXD Contusion of abdominal wall, subsequent encounter: Secondary | ICD-10-CM | POA: Diagnosis not present

## 2020-12-23 DIAGNOSIS — F0390 Unspecified dementia without behavioral disturbance: Secondary | ICD-10-CM | POA: Diagnosis not present

## 2020-12-24 DIAGNOSIS — S271XXD Traumatic hemothorax, subsequent encounter: Secondary | ICD-10-CM | POA: Diagnosis not present

## 2020-12-24 DIAGNOSIS — W19XXXA Unspecified fall, initial encounter: Secondary | ICD-10-CM | POA: Diagnosis not present

## 2020-12-24 DIAGNOSIS — F339 Major depressive disorder, recurrent, unspecified: Secondary | ICD-10-CM | POA: Diagnosis not present

## 2020-12-24 DIAGNOSIS — S301XXA Contusion of abdominal wall, initial encounter: Secondary | ICD-10-CM | POA: Diagnosis not present

## 2020-12-24 DIAGNOSIS — F0390 Unspecified dementia without behavioral disturbance: Secondary | ICD-10-CM | POA: Diagnosis not present

## 2020-12-24 DIAGNOSIS — M5136 Other intervertebral disc degeneration, lumbar region: Secondary | ICD-10-CM | POA: Diagnosis not present

## 2020-12-24 DIAGNOSIS — G20 Parkinson's disease: Secondary | ICD-10-CM | POA: Diagnosis not present

## 2020-12-24 DIAGNOSIS — S32010D Wedge compression fracture of first lumbar vertebra, subsequent encounter for fracture with routine healing: Secondary | ICD-10-CM | POA: Diagnosis not present

## 2020-12-24 DIAGNOSIS — R5381 Other malaise: Secondary | ICD-10-CM | POA: Diagnosis not present

## 2021-06-09 IMAGING — CT CT HEAD W/O CM
3 series · 16 of 47 positions shown, 19 images · non-contrast
Comparison: 05/21/2015, 05/28/2018

CLINICAL DATA: Headache after fall

EXAM:
CT HEAD WITHOUT CONTRAST
TECHNIQUE: Contiguous axial images were obtained from the base of the skull
through the vertex without intravenous contrast.

[Series 2: head wo · axial · 0.45mm/px · z∈[+1560,+1715]mm · 10 of 37 slices shown, 13 images]
[im 3/37  brain]
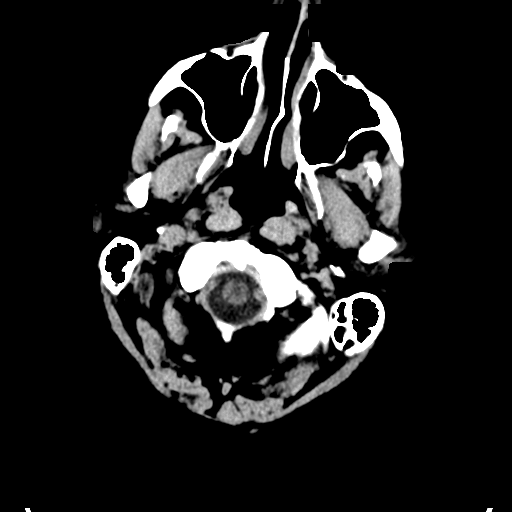
[im 3/37  bone]
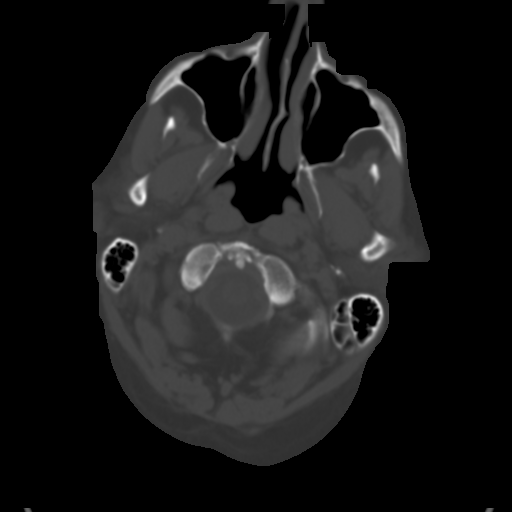
[im 7/37  brain]
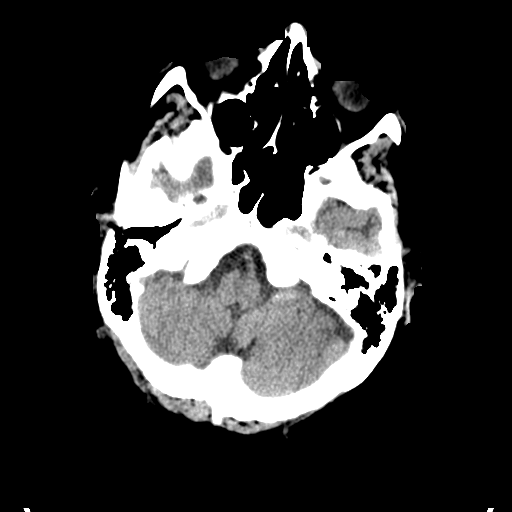
[im 10/37  brain]
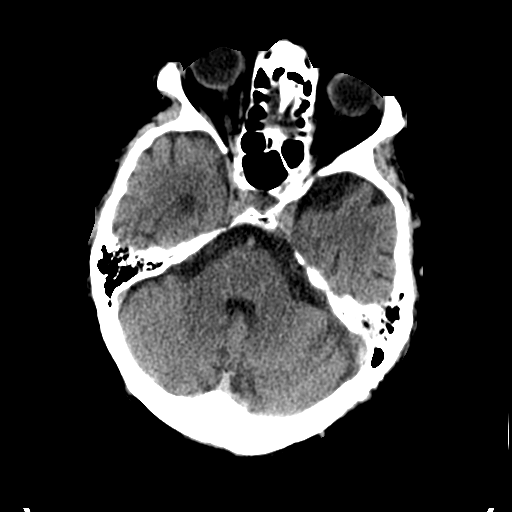
[im 13/37  brain]
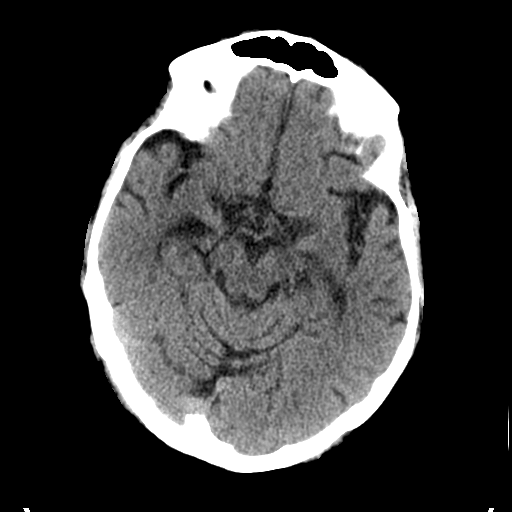
[im 17/37  brain]
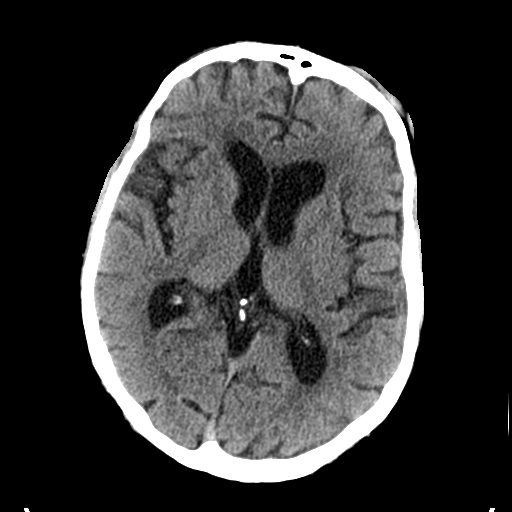
[im 17/37  bone]
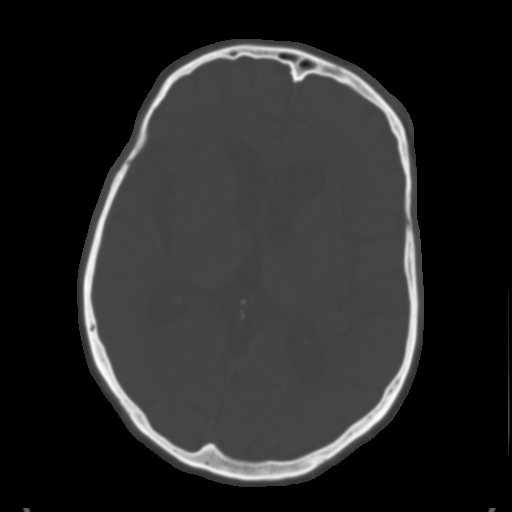
[im 20/37  brain]
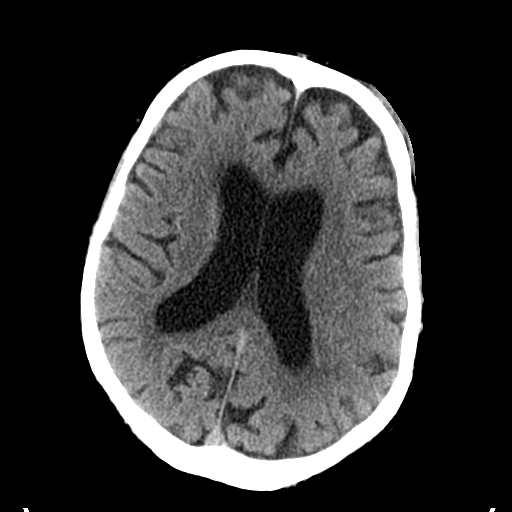
[im 24/37  brain]
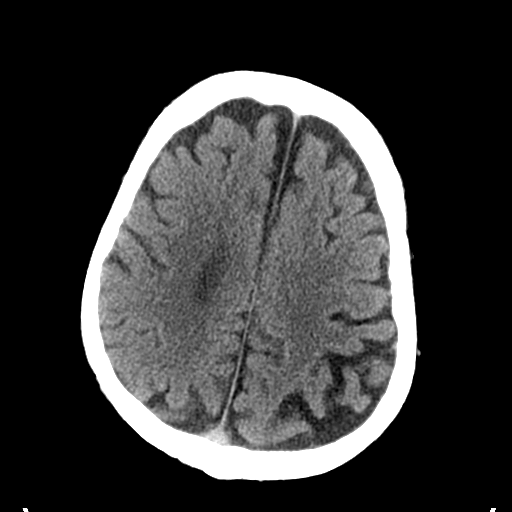
[im 28/37  brain]
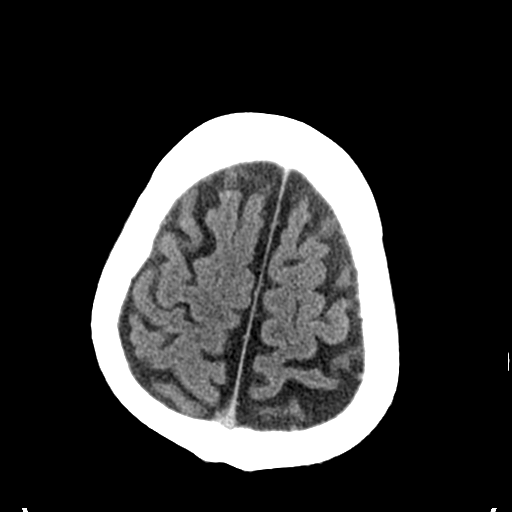
[im 30/37  brain]
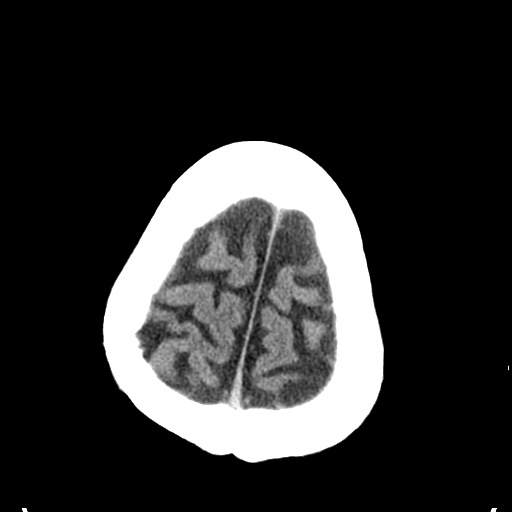
[im 30/37  bone]
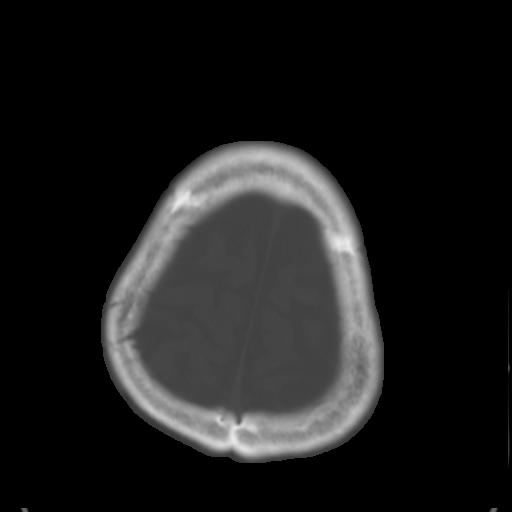
[im 34/37  brain]
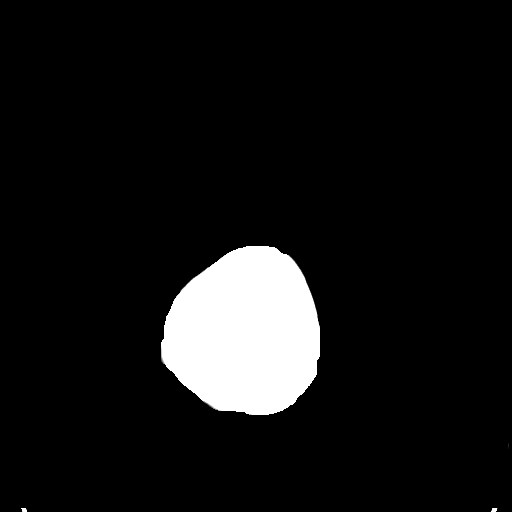

[Series 5: coronal soft tissue · coronal · 0.34mm/px · 3 of 72 slices shown]
[im 24/72  brain]
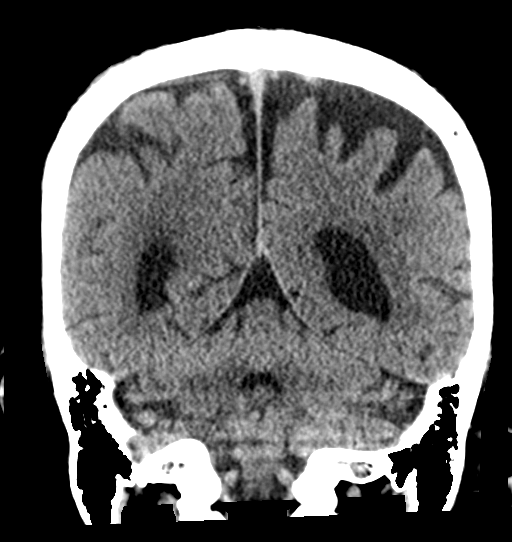
[im 32/72  brain]
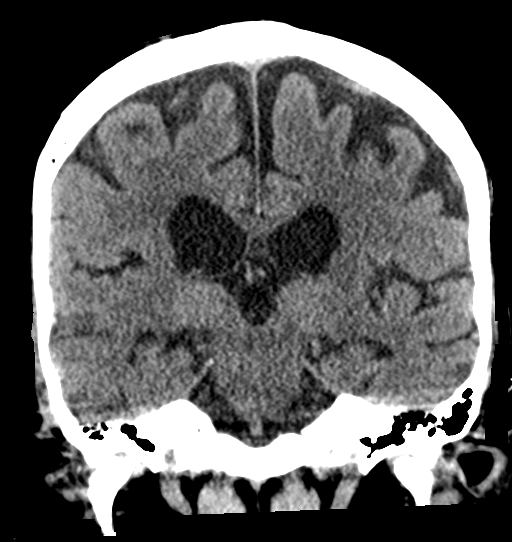
[im 40/72  brain]
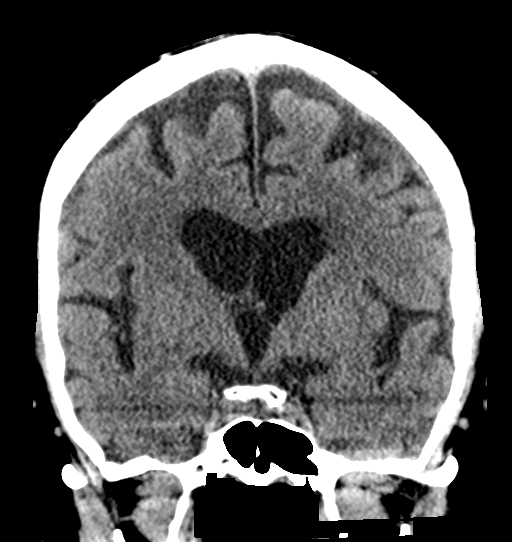

[Series 6: sagittal soft tissue · sagittal · 0.36mm/px · 3 of 58 slices shown]
[im 20/58  brain]
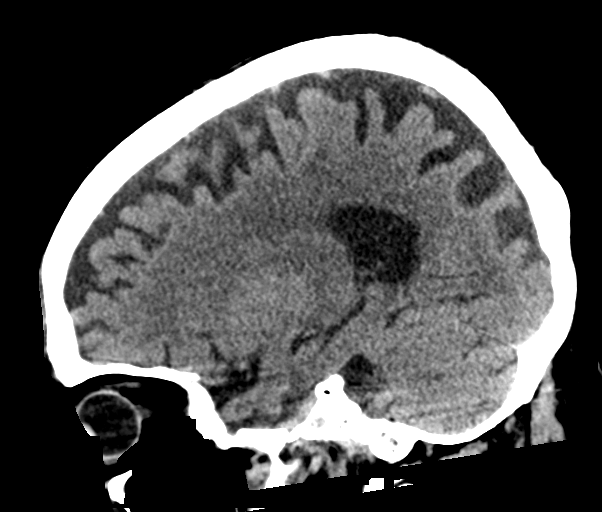
[im 29/58  brain]
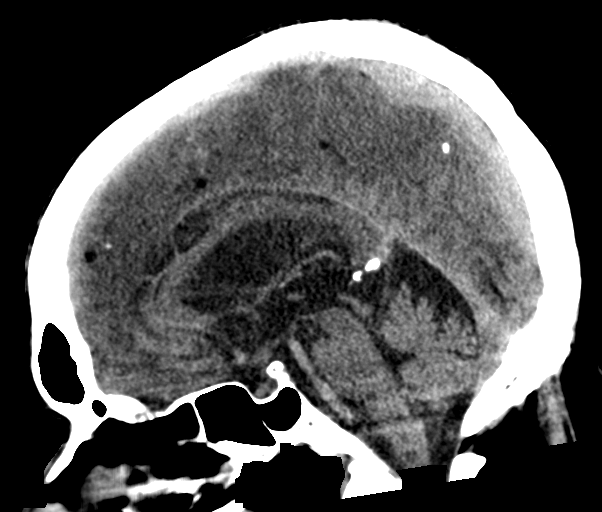
[im 39/58  brain]
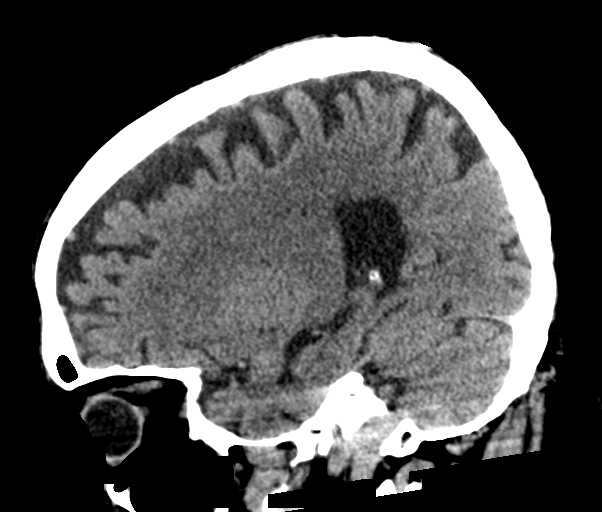

[16 of 47 positions shown; findings below may reference images not displayed]

FINDINGS: Brain: No evidence of acute infarction, hemorrhage, hydrocephalus,
extra-axial collection or mass lesion/mass effect. Scattered
low-density changes within the periventricular and subcortical white
matter compatible with chronic microvascular ischemic change. Mild
diffuse cerebral volume loss.

Vascular: No hyperdense vessel or unexpected calcification.

Skull: Negative for calvarial fracture.

Sinuses/Orbits: No acute finding.

Other: Superficial scalp hematoma overlying the left frontal region.
IMPRESSION: 1. No acute intracranial abnormality.
2. Left frontal scalp hematoma.  No underlying calvarial fracture.
3. Chronic microvascular ischemic changes and cerebral volume loss.

## 2022-07-28 DEATH — deceased
